# Patient Record
Sex: Female | Born: 1975 | Race: White | Hispanic: No | Marital: Single | Smoking: Never smoker
Health system: Southern US, Community
[De-identification: ages and names within clinical notes are randomized; demographics above are authoritative.]

## PROBLEM LIST (undated history)

## (undated) DIAGNOSIS — Q909 Down syndrome, unspecified: Secondary | ICD-10-CM

## (undated) DIAGNOSIS — F32A Depression, unspecified: Secondary | ICD-10-CM

## (undated) DIAGNOSIS — R51 Headache: Secondary | ICD-10-CM

## (undated) DIAGNOSIS — F329 Major depressive disorder, single episode, unspecified: Secondary | ICD-10-CM

## (undated) DIAGNOSIS — F419 Anxiety disorder, unspecified: Secondary | ICD-10-CM

## (undated) DIAGNOSIS — R519 Headache, unspecified: Secondary | ICD-10-CM

## (undated) DIAGNOSIS — R569 Unspecified convulsions: Secondary | ICD-10-CM

## (undated) DIAGNOSIS — E079 Disorder of thyroid, unspecified: Secondary | ICD-10-CM

## (undated) HISTORY — PX: OTHER SURGICAL HISTORY: SHX169

---

## 1999-01-06 ENCOUNTER — Encounter: Admission: RE | Admit: 1999-01-06 | Discharge: 1999-04-06 | Payer: Self-pay | Admitting: Family Medicine

## 2001-09-05 ENCOUNTER — Ambulatory Visit (HOSPITAL_COMMUNITY): Admission: RE | Admit: 2001-09-05 | Discharge: 2001-09-05 | Payer: Self-pay | Admitting: *Deleted

## 2002-01-18 ENCOUNTER — Encounter: Payer: Self-pay | Admitting: Otolaryngology

## 2002-01-19 ENCOUNTER — Ambulatory Visit (HOSPITAL_COMMUNITY): Admission: RE | Admit: 2002-01-19 | Discharge: 2002-01-19 | Payer: Self-pay | Admitting: Otolaryngology

## 2002-06-23 ENCOUNTER — Ambulatory Visit (HOSPITAL_BASED_OUTPATIENT_CLINIC_OR_DEPARTMENT_OTHER): Admission: RE | Admit: 2002-06-23 | Discharge: 2002-06-23 | Payer: Self-pay | Admitting: Otolaryngology

## 2002-12-02 ENCOUNTER — Emergency Department (HOSPITAL_COMMUNITY): Admission: EM | Admit: 2002-12-02 | Discharge: 2002-12-02 | Payer: Self-pay | Admitting: Emergency Medicine

## 2004-01-03 ENCOUNTER — Ambulatory Visit (HOSPITAL_BASED_OUTPATIENT_CLINIC_OR_DEPARTMENT_OTHER): Admission: RE | Admit: 2004-01-03 | Discharge: 2004-01-03 | Payer: Self-pay | Admitting: Obstetrics and Gynecology

## 2004-08-15 ENCOUNTER — Encounter: Admission: RE | Admit: 2004-08-15 | Discharge: 2004-11-13 | Payer: Self-pay | Admitting: Unknown Physician Specialty

## 2005-01-13 ENCOUNTER — Encounter: Admission: RE | Admit: 2005-01-13 | Discharge: 2005-01-13 | Payer: Self-pay | Admitting: Unknown Physician Specialty

## 2005-06-10 ENCOUNTER — Encounter: Admission: RE | Admit: 2005-06-10 | Discharge: 2005-09-08 | Payer: Self-pay | Admitting: Unknown Physician Specialty

## 2005-12-08 ENCOUNTER — Encounter: Admission: RE | Admit: 2005-12-08 | Discharge: 2005-12-08 | Payer: Self-pay | Admitting: Unknown Physician Specialty

## 2006-11-23 ENCOUNTER — Ambulatory Visit: Payer: Self-pay | Admitting: Family Medicine

## 2006-12-08 ENCOUNTER — Encounter (INDEPENDENT_AMBULATORY_CARE_PROVIDER_SITE_OTHER): Payer: Self-pay | Admitting: Family Medicine

## 2006-12-08 ENCOUNTER — Ambulatory Visit (HOSPITAL_COMMUNITY): Admission: RE | Admit: 2006-12-08 | Discharge: 2006-12-08 | Payer: Self-pay | Admitting: Family Medicine

## 2006-12-08 ENCOUNTER — Ambulatory Visit: Payer: Self-pay | Admitting: Internal Medicine

## 2007-01-25 ENCOUNTER — Ambulatory Visit: Payer: Self-pay | Admitting: Internal Medicine

## 2007-01-25 ENCOUNTER — Encounter (INDEPENDENT_AMBULATORY_CARE_PROVIDER_SITE_OTHER): Payer: Self-pay | Admitting: Nurse Practitioner

## 2007-01-25 LAB — CONVERTED CEMR LAB
Cholesterol: 161 mg/dL (ref 0–200)
HDL: 42 mg/dL (ref >39)
LDL Cholesterol: 86 mg/dL (ref 0–99)
Triglycerides: 163 mg/dL — ABNORMAL HIGH (ref <150)

## 2007-03-16 DIAGNOSIS — F411 Generalized anxiety disorder: Secondary | ICD-10-CM | POA: Insufficient documentation

## 2007-03-16 DIAGNOSIS — E039 Hypothyroidism, unspecified: Secondary | ICD-10-CM

## 2007-03-16 DIAGNOSIS — Q909 Down syndrome, unspecified: Secondary | ICD-10-CM | POA: Insufficient documentation

## 2007-03-16 DIAGNOSIS — M21619 Bunion of unspecified foot: Secondary | ICD-10-CM

## 2009-05-21 ENCOUNTER — Ambulatory Visit (HOSPITAL_COMMUNITY): Admission: RE | Admit: 2009-05-21 | Discharge: 2009-05-21 | Payer: Self-pay | Admitting: Cardiovascular Disease

## 2010-06-08 ENCOUNTER — Encounter: Payer: Self-pay | Admitting: Cardiovascular Disease

## 2010-10-03 NOTE — Op Note (Signed)
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   PROCEDURE:  Right myringotomy with tube placement and left micro-ear exam  with debridement.   SURGEON:  Lucky Cowboy, M.D.   ANESTHESIA:  General.   ESTIMATED BLOOD LOSS:  None.   COMPLICATIONS:  None.   INDICATIONS:  This patient is a 35 year old female with Down syndrome who  has experienced chronic problems with otitis media.  Exam in the office  revealed bilateral occlusion on the ear canal, and the patient would now  allow debridement.  Moderate hearing losses were noted bilaterally.  For  this reason, the patient is taken to the operating room with planned tube  placement.   FINDINGS:  The patient was noted to have thickened right tympanic membrane  with mucoid middle ear fluid.  The findings on the left revealed tympanic  membrane perforation in the posterior superior quadrant with some middle ear  debris.  There was possibility of early cholesteatoma.   DESCRIPTION OF PROCEDURE:  The patient was taken to the operating room and  placed on the table in a supine position.  She was then placed under general  mask anesthesia and a #5 ear speculum placed into the right external  auditory canal.  A tympanotomy tube in the process of extruding noted in the  right ear under microscopic exam was noted in the posterior superior  quadrant.  This was removed and a myringotomy made in the anterior  inferior  quadrant.  A Richards 1.32 mm modified T-tube was then placed through the  tympanic membrane and secured in place with the pick.  Ciprofloxacin Otic  drops were instilled.  Attention was then turned to the left ear.  A  significant amount of debris was removed from the ear canal.  At this point,  middle ear debris was also removed which had the suggestion of  cholesteatoma.  The tympanic membrane perforation was approximately 30%  starting in the posterior superior quadrant, and involving some of the  posterior inferior quadrant.  Cipro HC was instilled.  The patient was  awakened from anesthesia and taken to the postanesthesia care unit in stable  condition.  There were no complications.  Lucky Cowboy, MD    SJ/MEDQ  D:  03/01/2002  T:  03/01/2002  Job:  [id number]

## 2010-10-03 NOTE — Cardiovascular Report (Signed)
[place]  Patient:    Ann Yang, Ann Yang Visit Number: [id number] MRN: [id number]          Service Type: END Location: ENDO Attending Physician:  Darlin Priestly Dictated by:   Netta Cedars, M.D. Proc. Date: 09/05/01 Admit Date:  09/05/2001 Discharge Date: 09/05/2001   CC:         Delorse Lek, M.D.  [facility]   Cardiac Catheterization  REFERRING PHYSICIAN:  Delorse Lek, M.D.  PROCEDURE:  Transesophageal echocardiogram, limited.  CARDIOLOGIST:  Netta Cedars, M.D.  INDICATIONS: She had an echocardiogram in February 2001 which revealed normal left ventricular size and function, mild aortic valve sclerosis without stenosis, thickened anterior mitral valve leaflet, no mitral valve prolapse, mild mitral regurgitation, moderate tricuspid regurgitation, ventricular septal defect as well as an atrial septal defect near the level of the AV valve.  This abnormality was what prompted the recommendation for transesophageal echocardiogram.  This has been scheduled and canceled on two to three occasions in the past secondary to scheduling conflicts.  [name] is a 35 year old white female with a history of Downs syndrome and congestive heart failure.  She is here today for further evaluation of possible endocardial cushion defect.  DESCRIPTION OF PROCEDURE: Consent was obtained prior to her arriving at the hospital.  She received 5 mg of Versed and 100 mcg of fentanyl IV.  We were able to intubated without difficulty, however, several minutes into the procedure she became quite agitated (extreme) and inadvertently pulled out her IV.  At this time, the procedure was abandoned as she was too agitated to a) safely continue the procedure and b) unable to obtain further interpretable images.  Therefore, the probe was removed and she suffered no further complications.  The patient was awake and alert at the  conclusion of the procedure and suffered no immediate complications.  IMPRESSION: 1. Bicuspid aortic valve. 2. Atrial septal defect versus endocardial cushion defect as it is quite    proximal to the atrioventricular valves.  This was measured at 0.75 cm.    Unfortunately, I was only able to obtain one measurement secondary to    extreme agitation. 3. Probable ventricular septal defect but unable to confirm. 4. Echodensity originating either from the tricuspid valve apparatus or    the intraventricular septum just below the tricuspid valve on the    right side.  This is very small but I was unable to quantify. 5. Unable secondary to the difficulties noted above to further    evaluate for left ventricular function, atrial size, regurgitant    lesions, etc. 6. If repeat transesophageal echocardiogram is requested would    recommend anesthesia to manage procedurally. Dictated by:   Netta Cedars, M.D. Attending Physician:  Darlin Priestly DD:  09/05/01 TD:  09/05/01 Job: [id number]

## 2010-10-03 NOTE — Op Note (Signed)
NAME:  Ann Yang, ORNSTEIN                           ACCOUNT NO.:  [id number]   MEDICAL RECORD NO.:  [id number]                   PATIENT TYPE:  AMB   LOCATION:  DSC                                  FACILITY:  [facility]   PHYSICIAN:  Lucky Cowboy, M.D.                    DATE OF BIRTH:  Jul 22, 1975   DATE OF PROCEDURE:  06/23/2002  DATE OF DISCHARGE:  06/23/2002                                 OPERATIVE REPORT   PREOPERATIVE DIAGNOSIS:  Chronic otitis media.   POSTOPERATIVE DIAGNOSIS:  Chronic otitis media.   PROCEDURE:  Right laryngotomy with tube placement and debridement, left  middle ear space.   SURGEON:  Lucky Cowboy, M.D.   ANESTHESIA:  General.   ESTIMATED BLOOD LOSS:  None.   COMPLICATIONS:  None.   INDICATIONS FOR PROCEDURE:  This patient is a 35 year old Downs syndrome  patient who has undergone tube placement in the past in the right ear.  She  has had some cerumen accumulation and the patient will not even allow  examination in the office.  For these reasons, the above procedures are  performed.   FINDINGS:  The patient was noted to have an extruded right tube.  For this  reason, the tube was replaced.  The middle ear had a scant amount of fluid  and some retraction.  The left tympanic membrane had approximately 40%  perforation involving the posterior, superior, inferior, and a portion of  the anterior inferior quadrants.  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  It was also attached to the malleolus.   PROCEDURE:  The patient was taken to the operating room and placed on the  table in the supine position.  She was then placed under general MAC  anesthesia and a #4 ear speculum placed into the right external auditory  canal.  With the aid of the operating microscope, cerumen was removed with a  curette and suctioned.  The existing tympanotomy tube in the canal was  removed.  A laryngotomy knife was used to make an incision in the anterior  inferior  quadrant.  No left fluid was evacuated.  A 1.27-mm internal  diameter T-tube was then placed through the tympanic membrane, secured in  place with a pick.  _______ was instilled.  Attention was turned to the left  ear.  In similar fashion, cerumen was removed and under the microscope,  cholesteatoma tunnel was removed from the posterior superior quadrant.  It  could not be completely removed.  The patient was awakened from anesthesia  and taken to the postanesthesia care unit in stable condition.  There were  no complications.  Lucky Cowboy, M.D.    SJ/MEDQ  D:  06/28/2002  T:  06/28/2002  Job:  [id number]

## 2010-10-03 NOTE — Op Note (Signed)
NAME:  Ann Yang, ASBILL                           ACCOUNT NO.:  [id number]   MEDICAL RECORD NO.:  [id number]                   PATIENT TYPE:  AMB   LOCATION:  NESC                                 FACILITY:  [facility]   PHYSICIAN:  Katherine Roan, M.D.               DATE OF BIRTH:  04-28-1976   DATE OF PROCEDURE:  01/03/2004  DATE OF DISCHARGE:                                 OPERATIVE REPORT   PREOPERATIVE DIAGNOSES:  Down syndrome (mentally challenged).   POSTOPERATIVE DIAGNOSES:  Down syndrome (mentally challenged).   OPERATION:  Pelvic exam and breast exam, gynecologic exam under anesthesia.   The patient was placed in the supine position, general anesthesia given and  breast and neck exam was normal, no masses noted. The axilla were free from  adenopathy. I felt no intraabdominal masses.  Pelvic examination revealed a  virginal introitus and hymeneal ring appeared to be intact.  A small  speculum was placed into the vagina, cervix was visualized, appeared normal.  The uterus was normal size and shape. No masses were noted in the adnexa and  rectovaginal confirmed.   IMPRESSION:  Normal GYN exam.                                               Katherine Roan, M.D.    SDM/MEDQ  D:  01/03/2004  T:  01/03/2004  Job:  [id number]

## 2010-10-03 NOTE — Op Note (Signed)
   NAMEKIMRA, KANTOR                           ACCOUNT NO.:  [id number]   MEDICAL RECORD NO.:  [id number]                   PATIENT TYPE:  OIB   LOCATION:  2899                                 FACILITY:  [facility]   PHYSICIAN:  Dorna Leitz, M.D.                 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   PROCEDURES:  1. Right myringotomy with tube placement.  2. On the left, micro ear examination with debridement.   SURGEON:  Dorna Leitz, M.D.   ANESTHESIA:  General.   ESTIMATED BLOOD LOSS:  None.   COMPLICATIONS:  None.   INDICATIONS:  Dictation stopped at that point.                                               Dorna Leitz, M.D.    SLJ/MEDQ  D:  01/19/2002  T:  01/19/2002  Job:  [id number]

## 2012-03-01 ENCOUNTER — Emergency Department (HOSPITAL_COMMUNITY)
Admission: EM | Admit: 2012-03-01 | Discharge: 2012-03-01 | Disposition: A | Discharge status: Home / Self Care | Payer: Medicaid Other | Source: Home / Self Care | Attending: Emergency Medicine | Admitting: Emergency Medicine

## 2012-03-01 ENCOUNTER — Emergency Department (INDEPENDENT_AMBULATORY_CARE_PROVIDER_SITE_OTHER): Payer: Medicaid Other

## 2012-03-01 ENCOUNTER — Encounter (HOSPITAL_COMMUNITY): Payer: Self-pay | Admitting: Emergency Medicine

## 2012-03-01 DIAGNOSIS — T148XXA Other injury of unspecified body region, initial encounter: Secondary | ICD-10-CM

## 2012-03-01 DIAGNOSIS — IMO0002 Reserved for concepts with insufficient information to code with codable children: Secondary | ICD-10-CM

## 2012-03-01 HISTORY — DX: Down syndrome, unspecified: Q90.9

## 2012-03-01 HISTORY — DX: Disorder of thyroid, unspecified: E07.9

## 2012-03-01 NOTE — ED Notes (Signed)
Guardian of patient came to hallway to check on delay.  Informed they would be seen.  Delay secondary to department acuity

## 2012-03-01 NOTE — ED Notes (Signed)
Right hand injury today around 10:00 a.m. Today.  Reports caught right hand in car door.  Injury to right index finger

## 2012-03-01 NOTE — ED Notes (Signed)
Tetanus within 5 years

## 2012-03-01 NOTE — ED Provider Notes (Signed)
Chief Complaint  Patient presents with  . Hand Pain    History of Present Illness:   Ann Yang is a 36 year old female with Down's syndrome who comes in today with her foster mother. She injured her right index finger today. She slammed it in a car door at day care. Her last tetanus shot was about 5 years ago. She able to move the finger well. She can't give much further history since she is nonverbal.  Review of Systems:  Unobtainable since the patient is nonverbal.  PMFSH:  Past medical history, family history, social history, meds, and allergies were reviewed.  Physical Exam:   Vital signs:  BP 128/74  Pulse 73  Temp 98.7 F (37.1 C) (Oral)  Resp 16  SpO2 97% Gen:  Alert and in no distress. Musculoskeletal: She has a small, L-shaped, flap laceration on the distal phalanx of her right index finger. She's able to move the finger well. Otherwise, all joints had a full a ROM with no swelling, bruising or deformity.  No edema, pulses full. Extremities were warm and pink.  Capillary refill was brisk.  Skin:  Clear, warm and dry.  No rash. Neuro:  Alert and oriented times 3.  Muscle strength was normal.  Sensation was intact to light touch.   Radiology:  Dg Finger Index Right  03/01/2012  *RADIOLOGY REPORT*  Clinical Data: Pain post trauma  RIGHT INDEX FINGER 2+V  Comparison: None.  Findings: Frontal, oblique, and lateral views were obtained.  There is no fracture or dislocation.  Joint spaces appear intact.  No erosive change.  IMPRESSION:  No fracture or dislocation.   Original Report Authenticated By: Arvin Collard. WOODRUFF III, M.D.    I reviewed the images independently and personally and concur with the radiologist's findings.  Course in Urgent Care Center:   The patient's foster mother and guardian suggested that we not suture this since she does not think the patient would be cooperative. Since it is so small I think it's okay to close it with Steri-Strips. The wound is cleansed with  saline, tincture of benzoin was applied around the laceration that was Steri-Stripped. A gauze bandage was then applied and the foster mother was instructed in wound care. She will watch out for signs of infection and return if there's any problems.  Assessment:  The encounter diagnosis was Laceration.  Plan:   1.  The following meds were prescribed:   New Prescriptions   No medications on file   2.  The patient was instructed in symptomatic care, including rest and activity, elevation, application of ice and compression.  Appropriate handouts were given. 3.  The patient was told to return if becoming worse in any way, and given some red flag symptoms that would indicate earlier return.   4.  The patient was told to follow up here if any sign of infection.   Reuben Likes, MD 03/01/12 2013

## 2012-07-19 ENCOUNTER — Emergency Department (HOSPITAL_COMMUNITY)
Admission: EM | Admit: 2012-07-19 | Discharge: 2012-07-19 | Disposition: A | Discharge status: Home / Self Care | Payer: Medicare Other | Attending: Emergency Medicine | Admitting: Emergency Medicine

## 2012-07-19 ENCOUNTER — Encounter (HOSPITAL_COMMUNITY): Payer: Self-pay | Admitting: Emergency Medicine

## 2012-07-19 ENCOUNTER — Emergency Department (HOSPITAL_COMMUNITY): Payer: Medicare Other

## 2012-07-19 DIAGNOSIS — E079 Disorder of thyroid, unspecified: Secondary | ICD-10-CM | POA: Insufficient documentation

## 2012-07-19 DIAGNOSIS — Y939 Activity, unspecified: Secondary | ICD-10-CM | POA: Insufficient documentation

## 2012-07-19 DIAGNOSIS — IMO0002 Reserved for concepts with insufficient information to code with codable children: Secondary | ICD-10-CM | POA: Insufficient documentation

## 2012-07-19 DIAGNOSIS — R079 Chest pain, unspecified: Secondary | ICD-10-CM | POA: Insufficient documentation

## 2012-07-19 DIAGNOSIS — Z79899 Other long term (current) drug therapy: Secondary | ICD-10-CM | POA: Insufficient documentation

## 2012-07-19 DIAGNOSIS — Z87798 Personal history of other (corrected) congenital malformations: Secondary | ICD-10-CM | POA: Insufficient documentation

## 2012-07-19 DIAGNOSIS — T17908A Unspecified foreign body in respiratory tract, part unspecified causing other injury, initial encounter: Secondary | ICD-10-CM | POA: Insufficient documentation

## 2012-07-19 DIAGNOSIS — T17320A Food in larynx causing asphyxiation, initial encounter: Secondary | ICD-10-CM

## 2012-07-19 DIAGNOSIS — Y929 Unspecified place or not applicable: Secondary | ICD-10-CM | POA: Insufficient documentation

## 2012-07-19 LAB — CBC WITH DIFFERENTIAL/PLATELET
Basophils Absolute: 0 10*3/uL (ref 0.0–0.1)
Lymphocytes Relative: 14 % (ref 12–46)
Neutro Abs: 5.8 10*3/uL (ref 1.7–7.7)
Neutrophils Relative %: 84 % — ABNORMAL HIGH (ref 43–77)
Platelets: 109 10*3/uL — ABNORMAL LOW (ref 150–400)
RDW: 12.6 % (ref 11.5–15.5)
WBC: 6.9 10*3/uL (ref 4.0–10.5)

## 2012-07-19 LAB — BASIC METABOLIC PANEL
CO2: 23 mEq/L (ref 19–32)
Calcium: 8.9 mg/dL (ref 8.4–10.5)
Chloride: 99 mEq/L (ref 96–112)
Sodium: 137 mEq/L (ref 135–145)

## 2012-07-19 MED ORDER — AMOXICILLIN-POT CLAVULANATE 875-125 MG PO TABS: 1.0000 | ORAL_TABLET | Freq: Two times a day (BID) | ORAL | Status: DC

## 2012-07-19 MED ORDER — SODIUM CHLORIDE 0.9 % IV BOLUS (SEPSIS)
1000.0000 mL | INTRAVENOUS | Status: AC
  Administered 2012-07-19: 1000 mL via INTRAVENOUS

## 2012-07-19 NOTE — ED Notes (Addendum)
Pt arrives via EMS for choking. On arrival pt with agonal breathing, went apneic, PEA on monitor, CPR initiated by EMS. Airway cleared of peanut butter became responsive. Arrives alert, following commands. 15g LLE IO

## 2012-07-19 NOTE — ED Notes (Signed)
Pt d/c home in NAD. Pt caregiver voiced understanding of d/c instructions and follow up care.

## 2012-07-19 NOTE — ED Provider Notes (Signed)
History     CSN: [id number]  Arrival date & time 07/19/12  1455   None     Chief Complaint  Patient presents with  . Choking    (Consider location/radiation/quality/duration/timing/severity/associated sxs/prior treatment) Patient is a 37 y.o. female presenting with foreign body. The history is limited by the absence of a caregiver and a developmental delay. No language interpreter was used.  Foreign Body  The foreign body is suspected to be aspirated. The foreign body is food. The incident was witnessed. The incident was witnessed/reported by a caregiver. Associated symptoms include chest pain and choking. Pertinent negatives include no vomiting and no difficulty breathing. Behavior: became unresponsive, had CPR by EMS, airway cleared & pulses returned. There were no sick contacts. She has received no recent medical care.    Past Medical History  Diagnosis Date  . Thyroid disease   . Down's syndrome     History reviewed. No pertinent past surgical history.  History reviewed. No pertinent family history.  History  Substance Use Topics  . Smoking status: Never Smoker   . Smokeless tobacco: Not on file  . Alcohol Use: No    OB History   Grav Para Term Preterm Abortions TAB SAB Ect Mult Living                  Review of Systems  Unable to perform ROS: Other  Eyes: Negative for photophobia and discharge.  Respiratory: Positive for choking. Negative for chest tightness.   Cardiovascular: Positive for chest pain.  Gastrointestinal: Negative for vomiting.    Allergies  Review of patient's allergies indicates no known allergies.  Home Medications   Current Outpatient Rx  Name  Route  Sig  Dispense  Refill  . ALPRAZolam (XANAX) 0.5 MG tablet   Oral   Take 0.5 mg by mouth 2 (two) times daily.         Marland Kitchen levothyroxine (SYNTHROID, LEVOTHROID) 50 MCG tablet   Oral   Take 50 mcg by mouth daily.         . risperiDONE (RISPERDAL) 2 MG tablet   Oral   Take 2  mg by mouth at bedtime.          . sertraline (ZOLOFT) 100 MG tablet   Oral   Take 100 mg by mouth at bedtime.          Marland Kitchen amoxicillin-clavulanate (AUGMENTIN) 875-125 MG per tablet   Oral   Take 1 tablet by mouth every 12 (twelve) hours. If Harriett Sine developed cough, fever, trouble breathing   14 tablet   0     BP 103/65  Pulse 84  Temp(Src) 94.9 F (34.9 C) (Rectal)  Resp 13  SpO2 96%  Physical Exam  Constitutional: She appears well-developed and well-nourished. No distress.  HENT:  Head: Normocephalic and atraumatic.  Mouth/Throat: No oropharyngeal exudate.  Eyes: Pupils are equal, round, and reactive to light.  Neck: Normal range of motion. Neck supple.  Cardiovascular: Normal rate, regular rhythm and normal heart sounds.  Exam reveals no gallop and no friction rub.   No murmur heard. Pulmonary/Chest: Effort normal and breath sounds normal. No respiratory distress. She has no wheezes. She has no rales. She exhibits tenderness.    Abdominal: Soft. Bowel sounds are normal. She exhibits no distension and no mass. There is no tenderness. There is no rebound and no guarding.  Musculoskeletal: Normal range of motion. She exhibits no edema and no tenderness.  Neurological: She is alert. She exhibits  normal muscle tone. GCS eye subscore is 4. GCS verbal subscore is 4. GCS motor subscore is 6.  Skin: Skin is warm and dry.  Psychiatric: She has a normal mood and affect.    ED Course  Procedures (including critical care time)  Labs Reviewed  CBC WITH DIFFERENTIAL - Abnormal; Notable for the following:    Platelets 109 (*)    Neutrophils Relative 84 (*)    All other components within normal limits  BASIC METABOLIC PANEL - Abnormal; Notable for the following:    Glucose, Bld 222 (*)    All other components within normal limits   Dg Chest Portable 1 View  07/19/2012  *RADIOLOGY REPORT*  Clinical Data: 37 year old female status post CPR following choking episode.  PORTABLE  CHEST - 1 VIEW  Comparison: Chest CT 05/31/2009.  Findings: AP portable semi upright view 1527 hours.  Kyphotic view. Cardiomegaly appears stable. Other mediastinal contours are within normal limits.  Mild respiratory motion artifact. Visualized tracheal air column is within normal limits. Basilar predominant increased interstitial opacity, favor vascular congestion.  No pneumothorax,  pleural effusion or consolidation.  IMPRESSION: Cardiomegaly and basilar predominant vascular congestion without overt pulmonary edema.   Original Report Authenticated By: Odessa Fleming III, M.D.      1. Airway obstruction due to foreign body, initial encounter   2. Choking due to food (regurgitated), initial encounter       MDM  Pt is a 37 y.o. female with pertinent PMHX of Down's syndrome, thyroid disease who presents after choking episode that lead to PEA.  EMS initially called out to choking, when fire arrived, pt was breathing spontaneously but they were unable to clear her airway. , When EMS arrived was pulseless & apneic.  CPR started, and airway cleared with forceps, pulses returned (thought to be peanut butter sandwich).  Upon arrival, pt sitting upright, interactive, alert, complains of chest pain.  Arrest likely due to airway obstruction.  Have ordered CXR, EKG, CBC, BMP, will monitor.    4:50 PM Pt has no complaints currently, I have spoken to caregiver who reports she is back to baseline.  BP in low 90's systolic, then into 80's w/o change in mentation.  In reviewing records by caregiver, two prior BP's recorded 106 and 100 systolic.  Caregiver reports her having a SBP in 90's in past as well.   6:03 PMBP improved.  Will d/c home with return precautions for new or worsening symptoms, specially for symptoms of pneumonia.  Rx given for Augmentin, which pt will begin if she develops cough, fever, trouble breathing, but will at that point return ot ED or f/u with PCP.    1. Airway obstruction due to foreign body,  initial encounter   2. Choking due to food (regurgitated), initial encounter      Labs and imaging considered in decision making, reviewed by myself.  Imaging interpreted by radiology. Pt care discussed with my attending, Dr. Freida Busman.    Toy Cookey, MD 07/20/12 [phone]

## 2012-07-19 NOTE — ED Notes (Signed)
MD notified of pt Low BP

## 2012-07-19 NOTE — ED Provider Notes (Signed)
I saw and evaluated the patient, reviewed the resident's note and I agree with the findings and plan.  Patient seen examined. No signs of respiratory distress at this time. Pulse oximetry stable. Will be discharged    Toy Baker, MD 07/19/12 [phone]

## 2012-07-20 NOTE — ED Provider Notes (Signed)
I saw and evaluated the patient, reviewed the resident's note and I agree with the findings and plan.  Anthony T Umana, MD 07/20/12 1353 

## 2013-10-02 ENCOUNTER — Emergency Department (HOSPITAL_COMMUNITY)
Admission: EM | Admit: 2013-10-02 | Discharge: 2013-10-02 | Disposition: A | Discharge status: Home / Self Care | Payer: Medicare Other | Source: Home / Self Care | Attending: Emergency Medicine | Admitting: Emergency Medicine

## 2013-10-02 ENCOUNTER — Encounter (HOSPITAL_COMMUNITY): Payer: Self-pay | Admitting: Emergency Medicine

## 2013-10-02 DIAGNOSIS — T148 Other injury of unspecified body region: Secondary | ICD-10-CM

## 2013-10-02 DIAGNOSIS — W57XXXA Bitten or stung by nonvenomous insect and other nonvenomous arthropods, initial encounter: Secondary | ICD-10-CM

## 2013-10-02 NOTE — Discharge Instructions (Signed)
Tick Bite Information Ticks are insects that attach themselves to the skin and draw blood for food. There are various types of ticks. Common types include wood ticks and deer ticks. Most ticks live in shrubs and grassy areas. Ticks can climb onto your body when you make contact with leaves or grass where the tick is waiting. The most common places on the body for ticks to attach themselves are the scalp, neck, armpits, waist, and groin. Most tick bites are harmless, but sometimes ticks carry germs that cause diseases. These germs can be spread to a person during the tick's feeding process. The chance of a disease spreading through a tick bite depends on:   The type of tick.  Time of year.   How long the tick is attached.   Geographic location.  HOW CAN YOU PREVENT TICK BITES? Take these steps to help prevent tick bites when you are outdoors:  Wear protective clothing. Long sleeves and long pants are best.   Wear white clothes so you can see ticks more easily.  Tuck your pant legs into your socks.   If walking on a trail, stay in the middle of the trail to avoid brushing against bushes.  Avoid walking through areas with long grass.  Put insect repellent on all exposed skin and along boot tops, pant legs, and sleeve cuffs.   Check clothing, hair, and skin repeatedly and before going inside.   Brush off any ticks that are not attached.  Take a shower or bath as soon as possible after being outdoors.  WHAT IS THE PROPER WAY TO REMOVE A TICK? Ticks should be removed as soon as possible to help prevent diseases caused by tick bites. 1. If latex gloves are available, put them on before trying to remove a tick.  2. Using fine-point tweezers, grasp the tick as close to the skin as possible. You may also use curved forceps or a tick removal tool. Grasp the tick as close to its head as possible. Avoid grasping the tick on its body. 3. Pull gently with steady upward pressure until  the tick lets go. Do not twist the tick or jerk it suddenly. This may break off the tick's head or mouth parts. 4. Do not squeeze or crush the tick's body. This could force disease-carrying fluids from the tick into your body.  5. After the tick is removed, wash the bite area and your hands with soap and water or other disinfectant such as alcohol. 6. Apply a small amount of antiseptic cream or ointment to the bite site.  7. Wash and disinfect any instruments that were used.  Do not try to remove a tick by applying a hot match, petroleum jelly, or fingernail polish to the tick. These methods do not work and may increase the chances of disease being spread from the tick bite.  WHEN SHOULD YOU SEEK MEDICAL CARE? Contact your health care provider if you are unable to remove a tick from your skin or if a part of the tick breaks off and is stuck in the skin.  After a tick bite, you need to be aware of signs and symptoms that could be related to diseases spread by ticks. Contact your health care provider if you develop any of the following in the days or weeks after the tick bite:  Unexplained fever.  Rash. A circular rash that appears days or weeks after the tick bite may indicate the possibility of Lyme disease. The rash may resemble   a target with a bull's-eye and may occur at a different part of your body than the tick bite.  Redness and swelling in the area of the tick bite.   Tender, swollen lymph glands.   Diarrhea.   Weight loss.   Cough.   Fatigue.   Muscle, joint, or bone pain.   Abdominal pain.   Headache.   Lethargy or a change in your level of consciousness.  Difficulty walking or moving your legs.   Numbness in the legs.   Paralysis.  Shortness of breath.   Confusion.   Repeated vomiting.  Document Released: 05/01/2000 Document Revised: 02/22/2013 Document Reviewed: 10/12/2012 Exit[employer] Patient Information 2014 [employer].  

## 2013-10-02 NOTE — ED Provider Notes (Signed)
  Chief Complaint   Chief Complaint  Patient presents with  . 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 The group home worker noted a tick on her scalp today. She thinks is only been present for a few hours. There is no fever, headache, or generalized rash.  Review of Systems   Other than as noted above, the patient denies any of the following symptoms: Systemic:  No fevers, chills, sweats, weight loss or gain, fatigue, or tiredness.  PMFSH   Past medical history, family history, social history, meds, and allergies were reviewed.    Physical Examination    Vital signs:  BP 114/63  Pulse 68  Temp(Src) 98 F (36.7 C) (Oral)  Resp 14  SpO2 100% General:  Alert and oriented.  In no distress.  Skin warm and dry. Scalp: There is a tick attached to her left parietal area.  Course in [facility]   The area was prepped with alcohol and the tick was easily removed with a forceps.  Assessment   The encounter diagnosis was Tick bite.  Plan   1.  Meds:  The following meds were prescribed:   Discharge Medication List as of 10/02/2013 11:15 AM      2.  Patient Education/Counseling:  The patient was given appropriate handouts, self care instructions, and instructed in symptomatic relief.  Suggested application of antibiotic ointment for 24-48 hours.  3.  Follow up:  The patient was told to follow up here if no better in 3 to 4 days, or sooner if becoming worse in any way, and given some red flag symptoms such as fever, headache, muscle aches, or generalized rash which would prompt immediate return.  Follow up here as necessary.      Reuben Likesavid C Hilda Wexler, MD 10/02/13 [id number]

## 2013-10-02 NOTE — ED Notes (Signed)
Reports tick on scalp noticed today.  Denies any other concerns.

## 2013-10-10 ENCOUNTER — Encounter (HOSPITAL_COMMUNITY): Payer: Self-pay | Admitting: Emergency Medicine

## 2013-10-10 ENCOUNTER — Emergency Department (INDEPENDENT_AMBULATORY_CARE_PROVIDER_SITE_OTHER)
Admission: EM | Admit: 2013-10-10 | Discharge: 2013-10-10 | Disposition: A | Discharge status: Home / Self Care | Payer: Medicare Other | Source: Home / Self Care | Attending: Family Medicine | Admitting: Family Medicine

## 2013-10-10 DIAGNOSIS — S1093XA Contusion of unspecified part of neck, initial encounter: Secondary | ICD-10-CM

## 2013-10-10 DIAGNOSIS — S0003XA Contusion of scalp, initial encounter: Secondary | ICD-10-CM

## 2013-10-10 DIAGNOSIS — S0083XA Contusion of other part of head, initial encounter: Secondary | ICD-10-CM

## 2013-10-10 NOTE — Discharge Instructions (Signed)
Facial or Scalp Contusion  A facial or scalp contusion is a deep bruise on the face or head. Injuries to the face and head generally cause a lot of swelling, especially around the eyes. Contusions are the result of an injury that caused bleeding under the skin. The contusion may turn blue, purple, or yellow. Minor injuries will give you a painless contusion, but more severe contusions may stay painful and swollen for a few weeks.   CAUSES   A facial or scalp contusion is caused by a blunt injury or trauma to the face or head area.   SIGNS AND SYMPTOMS   · Swelling of the injured area.    · Discoloration of the injured area.    · Tenderness, soreness, or pain in the injured area.    DIAGNOSIS   The diagnosis can be made by taking a medical history and doing a physical exam. An X-ray exam, CT scan, or MRI may be needed to determine if there are any associated injuries, such as broken bones (fractures).  TREATMENT   Often, the best treatment for a facial or scalp contusion is applying cold compresses to the injured area. Over-the-counter medicines may also be recommended for pain control.   HOME CARE INSTRUCTIONS   · Only take over-the-counter or prescription medicines as directed by your health care provider.    · Apply ice to the injured area.    · Put ice in a plastic bag.    · Place a towel between your skin and the bag.    · Leave the ice on for 20 minutes, 2 3 times a day.    SEEK MEDICAL CARE IF:  · You have bite problems.    · You have pain with chewing.    · You are concerned about facial defects.  SEEK IMMEDIATE MEDICAL CARE IF:  · You have severe pain or a headache that is not relieved by medicine.    · You have unusual sleepiness, confusion, or personality changes.    · You throw up (vomit).    · You have a persistent nosebleed.    · You have double vision or blurred vision.    · You have fluid drainage from your nose or ear.    · You have difficulty walking or using your arms or legs.    MAKE SURE YOU:    · Understand these instructions.  · Will watch your condition.  · Will get help right away if you are not doing well or get worse.  Document Released: 06/11/2004 Document Revised: 02/22/2013 Document Reviewed: 12/15/2012  ExitCare® Patient Information ©2014 [employer].

## 2013-10-10 NOTE — ED Notes (Signed)
Reports possibly falling and hitting left eye or care giver states that the patient may have hit her self in the eye.  Pt has been using cold compresses for comfort.

## 2013-10-10 NOTE — ED Provider Notes (Signed)
CSN: [id number]     Arrival date & time 10/10/13  0905 History   First MD Initiated Contact with Patient 10/10/13 [id number]     Chief Complaint  Patient presents with  . Fall  . Eye Problem   (Consider location/radiation/quality/duration/timing/severity/associated sxs/prior Treatment) HPI Comments: 38 year old female with history of Down's syndrome, low functioning and completely dependent on caregiver, presents brought in by the caregiver for evaluation of a bruise under her left eye. The caregiver first noticed this yesterday the patient says that she fell to the caregiver is unsure of when this happened or what the circumstances were. There was some redness and it seemed irritated yesterday. The patient has hit herself in the eye before causing bruises but nothing this bad. Today, there is bruising with a very small amount of swelling. The patient does not seem to be bothered by this at all today. The caregiver has applied ice.  Patient is a 38 y.o. female presenting with fall and eye problem.  Fall  Eye Problem   Past Medical History  Diagnosis Date  . Thyroid disease   . Down's syndrome    History reviewed. No pertinent past surgical history. History reviewed. No pertinent family history. History  Substance Use Topics  . Smoking status: Never Smoker   . Smokeless tobacco: Not on file  . Alcohol Use: No   OB History   Grav Para Term Preterm Abortions TAB SAB Ect Mult Living                 Review of Systems  Unable to perform ROS: Patient nonverbal  Eyes:       Bruising under left eye, see history of present illness  All other systems reviewed and are negative.   Allergies  Review of patient's allergies indicates no known allergies.  Home Medications   Prior to Admission medications   Medication Sig Start Date End Date Taking? Authorizing Provider  ALPRAZolam Prudy Feeler(XANAX) 0.5 MG tablet Take 0.5 mg by mouth 2 (two) times daily.   Yes Historical Provider, MD    amoxicillin-clavulanate (AUGMENTIN) 875-125 MG per tablet Take 1 tablet by mouth every 12 (twelve) hours. If Harriett SineNancy developed cough, fever, trouble breathing 07/19/12  Yes Shanna CiscoMegan E Docherty, MD  levothyroxine (SYNTHROID, LEVOTHROID) 50 MCG tablet Take 50 mcg by mouth daily.   Yes Historical Provider, MD  risperiDONE (RISPERDAL) 2 MG tablet Take 2 mg by mouth at bedtime.    Yes Historical Provider, MD  sertraline (ZOLOFT) 100 MG tablet Take 100 mg by mouth at bedtime.    Yes Historical Provider, MD   BP 102/59  Pulse 72  Temp(Src) 98 F (36.7 C) (Oral)  Resp 16  SpO2 99% Physical Exam  Nursing note and vitals reviewed. Constitutional: She is oriented to person, place, and time. Vital signs are normal. She appears well-developed and well-nourished. No distress.  HENT:  Head: Normocephalic. Head is with contusion.    Eyes: Conjunctivae and EOM are normal. Pupils are equal, round, and reactive to light. Right eye exhibits no discharge. Left eye exhibits no discharge.  Pulmonary/Chest: Effort normal. No respiratory distress.  Neurological: She is alert and oriented to person, place, and time. She has normal strength. Coordination normal.  Skin: Skin is warm and dry. No rash noted. She is not diaphoretic.  Psychiatric: She has a normal mood and affect. Judgment normal.    ED Course  Procedures (including critical care time) Labs Review Labs Reviewed - No data to display  Imaging Review  No results found.   MDM   1. Bruise of face    Mild contusion, with no apparent tenderness with palpation of the orbital rim. Extraocular movements are intact without any pain. No signs of entrapment. The eyes are not injected or irritated. No indicatino or any serious injury.  Continue ice as needed. Ibuprofen if this becomes uncomfortable. Followup if worsening.    Graylon Good, PA-C 10/10/13 [phone]

## 2013-10-10 NOTE — ED Provider Notes (Signed)
Medical screening examination/treatment/procedure(s) were performed by resident physician or non-physician practitioner and as supervising physician I was immediately available for consultation/collaboration.   Barkley Bruns MD.   [name], MD 10/10/13 [phone]

## 2014-12-13 ENCOUNTER — Encounter: Payer: Self-pay | Admitting: Neurology

## 2014-12-13 ENCOUNTER — Ambulatory Visit (INDEPENDENT_AMBULATORY_CARE_PROVIDER_SITE_OTHER): Payer: Medicare Other | Admitting: Neurology

## 2014-12-13 VITALS — BP 87/60 | HR 81 | Ht <= 58 in | Wt 103.0 lb

## 2014-12-13 DIAGNOSIS — Q909 Down syndrome, unspecified: Secondary | ICD-10-CM

## 2014-12-13 DIAGNOSIS — R569 Unspecified convulsions: Secondary | ICD-10-CM | POA: Diagnosis not present

## 2014-12-13 DIAGNOSIS — G40209 Localization-related (focal) (partial) symptomatic epilepsy and epileptic syndromes with complex partial seizures, not intractable, without status epilepticus: Secondary | ICD-10-CM

## 2014-12-13 MED ORDER — LAMOTRIGINE ER 25 & 50 & 100 MG PO KIT: PACK | ORAL | Status: DC

## 2014-12-13 MED ORDER — LAMOTRIGINE ER 100 MG PO TB24: 100.0000 mg | ORAL_TABLET | Freq: Every day | ORAL | Status: DC

## 2014-12-13 NOTE — Progress Notes (Signed)
PATIENT: Ann Yang DOB: 04-18-1976  Chief Complaint  Patient presents with  . Seizure-like activity    She is here with her [facility] provider ([facility]), Ann Yang, with whom she resides.  She has had several episodes of loss of consciousness.  She does not have a history of seizures.  . Down Syndrome    HISTORICAL  Ann Yang is a 39 years old right-handed female, accompanied by her [facility] provider Ann Yang, seen in refer from her primary care nurse practitioner Ann Yang for evaluation of possible seizure  She was born with Down's syndrome, lived in nursing facility for many years, she moved to current home with Ann Yang since August 2015, there was no reported history of seizure, at baseline, she has limited verbal ability, unsteady gait, able to feed herself, put on clothes with help.  She had 2 events, first one was in November 14 2014, she was at her day program, sitting the chair, she suddenly slumped over, loss of consciousness, lasting for a few minutes, followed by positive and confusion, EMS was called, vital signs was fine, she was not taken to the hospital  Second witnessed event was in December 02 2014, while walking in the department store, she suddenly stare into the space, slumped over, there was no body jerking movement, lasted about few minutes, with bowel and bladder incontinence, post event, she cried, remained confused for about 30 minutes.  Ann Yang also mentioned some staring episode.  REVIEW OF SYSTEMS: Full 14 system review of systems performed and notable only for easy bruising, allergy, passing out  ALLERGIES: No Known Allergies  HOME MEDICATIONS: Current Outpatient Prescriptions  Medication Sig Dispense Refill  . ALPRAZolam (XANAX) 0.5 MG tablet Take 0.5 mg by mouth 2 (two) times daily.    Marland Kitchen levothyroxine (SYNTHROID, LEVOTHROID) 50 MCG tablet Take 50 mcg by mouth daily.    Marland Kitchen loratadine (CLARITIN) 10 MG tablet Take by  mouth.    . Olopatadine HCl 0.2 % SOLN Apply to eye.    . polyethylene glycol powder (GLYCOLAX/MIRALAX) powder Take 17 g by mouth.    . risperiDONE (RISPERDAL) 2 MG tablet Take 2 mg by mouth at bedtime.     . sertraline (ZOLOFT) 100 MG tablet Take 100 mg by mouth at bedtime.     . traZODone (DESYREL) 50 MG tablet Take by mouth.    . triamcinolone cream (KENALOG) 0.5 % Mix one inch of cream in palm of head with unscented lotion and rub into affected area twice a day as needed.       PAST MEDICAL HISTORY: Past Medical History  Diagnosis Date  . Thyroid disease   . Down's syndrome     PAST SURGICAL HISTORY: No past surgical history on file.  FAMILY HISTORY: No family history on file.  SOCIAL HISTORY:  History   Social History  . Marital Status: Single    Spouse Name: N/A  . Number of Children: N/A  . Years of Education: N/A   Occupational History  . Not on file.   Social History Main Topics  . Smoking status: Never Smoker   . Smokeless tobacco: Not on file  . Alcohol Use: No  . Drug Use: No  . Sexual Activity: No   Other Topics Concern  . Not on file   Social History Narrative  . No narrative on file     PHYSICAL EXAM   There were no vitals filed for this visit.  Not recorded  There is no height or weight on file to calculate BMI.  PHYSICAL EXAMNIATION:  Gen: NAD, conversant, well nourised, obese, well groomed                     Cardiovascular: Regular rate rhythm, no peripheral edema, warm, nontender. Eyes: Conjunctivae clear without exudates or hemorrhage Neck: Supple, no carotid bruise. Pulmonary: Clear to auscultation bilaterally   NEUROLOGICAL EXAM:  MENTAL STATUS: Speech/cognitive: Limited verbal ability, follow commands, typical Down's face  CRANIAL NERVES: CN II: Pupils were equal round reactive to light,  CN III, IV, VI: extraocular movement are normal. No ptosis. CN V: Facial sensation is intact to pinprick in all 3 divisions  bilaterally. Corneal responses are intact.  CN VII: Face is symmetric with normal eye closure and smile. CN VIII: Hearing is normal to rubbing fingers CN IX, X: Palate elevates symmetrically. Phonation is normal. CN XI: Head turning and shoulder shrug are intact CN XII: Tongue is midline with normal movements and no atrophy.  MOTOR: Moving arms and legs without difficulties  REFLEXES: Present and symmetric   SENSORY:  Not reliable  COORDINATION: No dysmetria  GAIT/STANCE: Wide base, unsteady gait.   DIAGNOSTIC DATA (LABS, IMAGING, TESTING) - I reviewed patient records, labs, notes, testing and imaging myself where available.  Lab Results  Component Value Date   WBC 6.9 07/19/2012   HGB 13.8 07/19/2012   HCT 39.6 07/19/2012   MCV 96.8 07/19/2012   PLT 109* 07/19/2012      Component Value Date/Time   NA 137 07/19/2012 1514   K 3.7 07/19/2012 1514   CL 99 07/19/2012 1514   CO2 23 07/19/2012 1514   GLUCOSE 222* 07/19/2012 1514   BUN 12 07/19/2012 1514   CREATININE 0.77 07/19/2012 1514   CALCIUM 8.9 07/19/2012 1514   GFRNONAA >90 07/19/2012 1514   GFRAA >90 07/19/2012 1514   Lab Results  Component Value Date   CHOL 161 01/25/2007   HDL 42 01/25/2007   LDLCALC 86 01/25/2007   TRIG 163* 01/25/2007   CHOLHDL 3.8 Ratio 01/25/2007    ASSESSMENT AND PLAN  Ann Yang is a 39 y.o. female    1.  Down's  syndrome  2.  Seizure, CAT scan of the brain, EEG, starting lamotrigine ER, titrating to 100 mg every night, document all event    Levert Feinstein, M.D. Ph.D.  [facility] [address] [place], [place] [address] Ph: [phone] Fax: [phone]

## 2014-12-26 ENCOUNTER — Ambulatory Visit (INDEPENDENT_AMBULATORY_CARE_PROVIDER_SITE_OTHER): Payer: Medicare Other

## 2014-12-26 DIAGNOSIS — R569 Unspecified convulsions: Secondary | ICD-10-CM

## 2014-12-26 DIAGNOSIS — Q909 Down syndrome, unspecified: Secondary | ICD-10-CM

## 2014-12-26 NOTE — Procedures (Signed)
   HISTORY: 39 years old female, with history of Down syndrome, presented with possible seizure.  TECHNIQUE:  16 channel EEG was performed based on standard 10-16 international system. One channel was dedicated to EKG, which has demonstrates normal sinus rhythm of beats per minutes.  Upon awakening, the posterior background activity was well-developed, in alpha range, 10 Hz,  reactive to eye opening and closure. There was frequent bilateral frontal dominant smaller amplitude, beta range activity   There was no evidence of epileptiform discharge.There was occasionally P3,T5 sharp transient.     Photic stimulation was performed, which induced a symmetric photic driving.  Hyperventilation was performed, there was no abnormality elicit.  No sleep was achieved.  CONCLUSION: This is a  normal awake EEG.  There is no electrodiagnostic evidence of epileptiform discharges, occasional appearance of P3, T5 sharp transient of unknown clinical significance.

## 2014-12-27 ENCOUNTER — Telehealth: Payer: Self-pay | Admitting: *Deleted

## 2014-12-27 NOTE — Telephone Encounter (Signed)
I have spoken with [facility] provider today and per Dr. Terrace Arabia, advised that EEG was normal.  She verbalized understanding of same/fim

## 2014-12-27 NOTE — Telephone Encounter (Signed)
-----   Message from Levert Feinstein, MD sent at 12/27/2014 11:10 AM EDT ----- Please call patient for normal EEG

## 2014-12-31 DIAGNOSIS — R569 Unspecified convulsions: Secondary | ICD-10-CM | POA: Diagnosis not present

## 2015-01-02 ENCOUNTER — Other Ambulatory Visit: Payer: Self-pay | Admitting: Diagnostic Neuroimaging

## 2015-01-02 DIAGNOSIS — Q909 Down syndrome, unspecified: Secondary | ICD-10-CM

## 2015-01-02 DIAGNOSIS — G40209 Localization-related (focal) (partial) symptomatic epilepsy and epileptic syndromes with complex partial seizures, not intractable, without status epilepticus: Secondary | ICD-10-CM

## 2015-01-03 ENCOUNTER — Telehealth: Payer: Self-pay | Admitting: Neurology

## 2015-01-03 NOTE — Telephone Encounter (Signed)
Spoke to [employer] ([facility] caregiver on [employer]) - she is aware of CT results.

## 2015-01-03 NOTE — Telephone Encounter (Signed)
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 2. No acute findings.

## 2015-02-13 ENCOUNTER — Ambulatory Visit (INDEPENDENT_AMBULATORY_CARE_PROVIDER_SITE_OTHER): Payer: Medicare Other | Admitting: Neurology

## 2015-02-13 ENCOUNTER — Encounter: Payer: Self-pay | Admitting: Neurology

## 2015-02-13 VITALS — BP 102/62 | HR 74 | Ht <= 58 in | Wt 104.0 lb

## 2015-02-13 DIAGNOSIS — Q909 Down syndrome, unspecified: Secondary | ICD-10-CM

## 2015-02-13 DIAGNOSIS — R569 Unspecified convulsions: Secondary | ICD-10-CM

## 2015-02-13 MED ORDER — LAMOTRIGINE ER 100 MG PO TB24: 200.0000 mg | ORAL_TABLET | Freq: Every day | ORAL | Status: DC

## 2015-02-13 NOTE — Progress Notes (Signed)
Chief Complaint  Patient presents with  . Down Syndrome    She is here with her [facility] provider, [facility].  She is taking her Lamictal XR  at bedtime and her seizure activity has decreased in frequency.  [employer]'s care coordinator, at her day program, has reported that she is still having staring spells at school.      PATIENT: Ann Yang DOB: 1975-09-15  Chief Complaint  Patient presents with  . Down Syndrome    She is here with her [facility] provider, [facility].  She is taking her Lamictal XR  at bedtime and her seizure activity has decreased in frequency.  Ann Yang's care coordinator, at her day program, has reported that she is still having staring spells at school.    HISTORICAL  Ann Yang is a 39 years old right-handed female, accompanied by her [facility] provider [facility], seen in refer from her primary care nurse practitioner Elizabeth Palau for evaluation of possible seizure in December 13 2014  She was born with Down's syndrome, lived in nursing facility for many years, she moved to current home with Meshell since August 2015, there was no reported history of seizure, at baseline, she has limited verbal ability, unsteady gait, able to feed herself, put on clothes with help.  She had 2 events, first one was in November 14 2014, she was at her day program, sitting the chair, she suddenly slumped over, loss of consciousness, lasting for a few minutes, followed by positive and confusion, EMS was called, vital signs was fine, she was not taken to the hospital  Second witnessed event was in December 02 2014, while walking in the department store, she suddenly stare into the space, slumped over, there was no body jerking movement, lasted about few minutes, with bowel and bladder incontinence, post event, she cried, remained confused for about 30 minutes.  Meshell also mentioned some staring episode.  UPDATE Sep 28th 2016: She was started on Lamotrigine xr  qhs, before that she  was having multiple episodses, staring, occasionally generalized seizure in a week, now only one small, staring spells once a week, there was no significant side effect noticed I have personally reviewed CAT scan of the brain in August 2016, enlarged fourth ventricle, partial agenesis of corpus callosum, no acute lesions, EEG was normal  REVIEW OF SYSTEMS: Full 14 system review of systems performed and notable only for bruise easily, seizure  ALLERGIES: No Known Allergies  HOME MEDICATIONS: Current Outpatient Prescriptions  Medication Sig Dispense Refill  . ALPRAZolam (XANAX) 0.5 MG tablet Take 0.5 mg by mouth 2 (two) times daily.    Marland Kitchen levothyroxine (SYNTHROID, LEVOTHROID) 50 MCG tablet Take 50 mcg by mouth daily.    Marland Kitchen loratadine (CLARITIN) 10 MG tablet Take by mouth.    . Olopatadine HCl 0.2 % SOLN Apply to eye.    . polyethylene glycol powder (GLYCOLAX/MIRALAX) powder Take 17 g by mouth.    . risperiDONE (RISPERDAL) 2 MG tablet Take 2 mg by mouth at bedtime.     . sertraline (ZOLOFT) 100 MG tablet Take 100 mg by mouth at bedtime.     . traZODone (DESYREL) 50 MG tablet Take by mouth.    . triamcinolone cream (KENALOG) 0.5 % Mix one inch of cream in palm of head with unscented lotion and rub into affected area twice a day as needed.       PAST MEDICAL HISTORY: Past Medical History  Diagnosis Date  . Thyroid disease   .  Down's syndrome     PAST SURGICAL HISTORY: Past Surgical History  Procedure Laterality Date  . No past surgery      FAMILY HISTORY: No family history on file.  SOCIAL HISTORY:  Social History   Social History  . Marital Status: Single    Spouse Name: N/A  . Number of Children: 0  . Years of Education: N/A   Occupational History  . Disabled    Social History Main Topics  . Smoking status: Never Smoker   . Smokeless tobacco: Not on file  . Alcohol Use: No  . Drug Use: No  . Sexual Activity: No   Other Topics Concern  . Not on file   Social  History Narrative   Lives with [facility] provider ([facility]).   Right-handed.   No caffeine use.     PHYSICAL EXAM   Filed Vitals:   02/13/15 1603  BP: 102/62  Pulse: 74  Height: 4' (1.219 m)  Weight: 104 lb (47.174 kg)    Not recorded      Body mass index is 31.75 kg/(m^2).  PHYSICAL EXAMNIATION:  Gen: NAD, conversant, well nourised, obese, well groomed                     Cardiovascular: Regular rate rhythm, no peripheral edema, warm, nontender. Eyes: Conjunctivae clear without exudates or hemorrhage Neck: Supple, no carotid bruise. Pulmonary: Clear to auscultation bilaterally   NEUROLOGICAL EXAM:  MENTAL STATUS: Speech/cognitive: Limited verbal ability, follow commands, typical Down's face  CRANIAL NERVES: CN II: Pupils were equal round reactive to light,  CN III, IV, VI: extraocular movement are normal. No ptosis. CN V: Facial sensation is intact to pinprick in all 3 divisions bilaterally. Corneal responses are intact.  CN VII: Face is symmetric with normal eye closure and smile. CN VIII: Hearing is normal to rubbing fingers CN IX, X: Palate elevates symmetrically. Phonation is normal. CN XI: Head turning and shoulder shrug are intact CN XII: Tongue is midline with normal movements and no atrophy.  MOTOR: Moving arms and legs without difficulties  REFLEXES: Present and symmetric   SENSORY:  Not reliable  COORDINATION: No dysmetria  GAIT/STANCE: Wide base, unsteady gait.   DIAGNOSTIC DATA (LABS, IMAGING, TESTING) - I reviewed patient records, labs, notes, testing and imaging myself where available.  Lab Results  Component Value Date   WBC 6.9 07/19/2012   HGB 13.8 07/19/2012   HCT 39.6 07/19/2012   MCV 96.8 07/19/2012   PLT 109* 07/19/2012      Component Value Date/Time   NA 137 07/19/2012 1514   K 3.7 07/19/2012 1514   CL 99 07/19/2012 1514   CO2 23 07/19/2012 1514   GLUCOSE 222* 07/19/2012 1514   BUN 12 07/19/2012 1514    CREATININE 0.77 07/19/2012 1514   CALCIUM 8.9 07/19/2012 1514   GFRNONAA >90 07/19/2012 1514   GFRAA >90 07/19/2012 1514   Lab Results  Component Value Date   CHOL 161 01/25/2007   HDL 42 01/25/2007   LDLCALC 86 01/25/2007   TRIG 163* 01/25/2007   CHOLHDL 3.8 Ratio 01/25/2007    ASSESSMENT AND PLAN  Ann Yang is a 39 y.o. female    Down's  syndrome  Epilepsy  Responded and tolerate lamotrigine XR 100 mg every night well, continue has occasionally staring spells  Increased lamotrigine xr 100 mg 2 tablets every night  Return to clinic with nurse practitioner in 3-4 months   Levert Feinstein, M.D.  Ph.D.  [facility] [address], [address] [place], [place] [address] Ph: [phone] Fax: [phone]

## 2015-02-18 ENCOUNTER — Telehealth: Payer: Self-pay | Admitting: *Deleted

## 2015-02-18 ENCOUNTER — Telehealth: Payer: Self-pay | Admitting: Neurology

## 2015-02-18 NOTE — Telephone Encounter (Signed)
Phone call from [facility] [phone] regarding LamoTRIgine (LAMICTAL XR) 100 MG TB24. Dr Terrace Arabia recently increased from 1 per night to 2 per night. Pharmacy states insurance is refusing this medication and recommended patient to call our office. Meshell advised they did go ahead and refilled the 1 per night medication and pharmacist advised to go ahead and give pt 2 however medication will run out in 15 days. Pharmacy is [facility] on [address].

## 2015-02-18 NOTE — Telephone Encounter (Signed)
I contacted Medicaid.  Spoke with Rob.  He said they are unable to conduct prior auth, and we would need to contact [facility] Ref # [id number] We do not have [employer] ins on file for this patient.  I called the pharmacy to obtain info.  Her ID # is [id number].  I contacted [employer] and provided clinical info.  They indicate no prior auth is required Ref # [id number].  I called the pharmacy back and they clarified this Rx did go through without any issues.

## 2015-02-18 NOTE — Telephone Encounter (Signed)
Form at the front desk ready for pick up

## 2015-03-31 ENCOUNTER — Emergency Department (HOSPITAL_COMMUNITY): Payer: Medicare Other

## 2015-03-31 ENCOUNTER — Encounter (HOSPITAL_COMMUNITY): Payer: Self-pay | Admitting: *Deleted

## 2015-03-31 ENCOUNTER — Inpatient Hospital Stay (HOSPITAL_COMMUNITY)
Admission: EM | Admit: 2015-03-31 | Discharge: 2015-04-01 | DRG: 178 | Disposition: A | Discharge status: Home / Self Care | Payer: Medicare Other | Attending: Internal Medicine | Admitting: Internal Medicine

## 2015-03-31 ENCOUNTER — Encounter (HOSPITAL_COMMUNITY): Payer: Self-pay

## 2015-03-31 ENCOUNTER — Emergency Department (INDEPENDENT_AMBULATORY_CARE_PROVIDER_SITE_OTHER)
Admission: EM | Admit: 2015-03-31 | Discharge: 2015-03-31 | Disposition: A | Discharge status: Other Acute Inpatient Hospital | Payer: Medicare Other | Source: Home / Self Care

## 2015-03-31 DIAGNOSIS — T18128A Food in esophagus causing other injury, initial encounter: Secondary | ICD-10-CM

## 2015-03-31 DIAGNOSIS — Q898 Other specified congenital malformations: Secondary | ICD-10-CM

## 2015-03-31 DIAGNOSIS — R6250 Unspecified lack of expected normal physiological development in childhood: Secondary | ICD-10-CM | POA: Diagnosis present

## 2015-03-31 DIAGNOSIS — K222 Esophageal obstruction: Secondary | ICD-10-CM

## 2015-03-31 DIAGNOSIS — R1110 Vomiting, unspecified: Secondary | ICD-10-CM | POA: Diagnosis present

## 2015-03-31 DIAGNOSIS — Y92099 Unspecified place in other non-institutional residence as the place of occurrence of the external cause: Secondary | ICD-10-CM | POA: Diagnosis not present

## 2015-03-31 DIAGNOSIS — R0989 Other specified symptoms and signs involving the circulatory and respiratory systems: Secondary | ICD-10-CM | POA: Diagnosis present

## 2015-03-31 DIAGNOSIS — N179 Acute kidney failure, unspecified: Secondary | ICD-10-CM

## 2015-03-31 DIAGNOSIS — E039 Hypothyroidism, unspecified: Secondary | ICD-10-CM | POA: Diagnosis present

## 2015-03-31 DIAGNOSIS — D696 Thrombocytopenia, unspecified: Secondary | ICD-10-CM | POA: Diagnosis present

## 2015-03-31 DIAGNOSIS — G40909 Epilepsy, unspecified, not intractable, without status epilepticus: Secondary | ICD-10-CM | POA: Diagnosis not present

## 2015-03-31 DIAGNOSIS — J690 Pneumonitis due to inhalation of food and vomit: Secondary | ICD-10-CM | POA: Diagnosis not present

## 2015-03-31 DIAGNOSIS — Q248 Other specified congenital malformations of heart: Secondary | ICD-10-CM

## 2015-03-31 DIAGNOSIS — R0902 Hypoxemia: Secondary | ICD-10-CM

## 2015-03-31 DIAGNOSIS — Q909 Down syndrome, unspecified: Secondary | ICD-10-CM | POA: Diagnosis not present

## 2015-03-31 HISTORY — DX: Depression, unspecified: F32.A

## 2015-03-31 HISTORY — DX: Unspecified convulsions: R56.9

## 2015-03-31 HISTORY — DX: Major depressive disorder, single episode, unspecified: F32.9

## 2015-03-31 HISTORY — DX: Headache, unspecified: R51.9

## 2015-03-31 HISTORY — DX: Anxiety disorder, unspecified: F41.9

## 2015-03-31 HISTORY — DX: Headache: R51

## 2015-03-31 LAB — COMPREHENSIVE METABOLIC PANEL WITH GFR
ALT: 14 U/L (ref 14–54)
AST: 21 U/L (ref 15–41)
Albumin: 3.8 g/dL (ref 3.5–5.0)
Alkaline Phosphatase: 68 U/L (ref 38–126)
Anion gap: 10 (ref 5–15)
BUN: 20 mg/dL (ref 6–20)
CO2: 24 mmol/L (ref 22–32)
Calcium: 8.7 mg/dL — ABNORMAL LOW (ref 8.9–10.3)
Chloride: 105 mmol/L (ref 101–111)
Creatinine, Ser: 1.06 mg/dL — ABNORMAL HIGH (ref 0.44–1.00)
GFR calc Af Amer: >60 mL/min
GFR calc non Af Amer: >60 mL/min
Glucose, Bld: 124 mg/dL — ABNORMAL HIGH (ref 65–99)
Potassium: 4 mmol/L (ref 3.5–5.1)
Sodium: 139 mmol/L (ref 135–145)
Total Bilirubin: 0.8 mg/dL (ref 0.3–1.2)
Total Protein: 6.8 g/dL (ref 6.5–8.1)

## 2015-03-31 LAB — CBC
HCT: 39.7 % (ref 36.0–46.0)
Hemoglobin: 13.5 g/dL (ref 12.0–15.0)
MCH: 33.1 pg (ref 26.0–34.0)
MCHC: 34 g/dL (ref 30.0–36.0)
MCV: 97.3 fL (ref 78.0–100.0)
Platelets: 112 10*3/uL — ABNORMAL LOW (ref 150–400)
RBC: 4.08 MIL/uL (ref 3.87–5.11)
RDW: 13.7 % (ref 11.5–15.5)
WBC: 12 10*3/uL — ABNORMAL HIGH (ref 4.0–10.5)

## 2015-03-31 MED ORDER — PIPERACILLIN-TAZOBACTAM 3.375 G IVPB 30 MIN
3.3750 g | Freq: Once | INTRAVENOUS | Status: AC
  Administered 2015-04-01: 3.375 g via INTRAVENOUS
  Filled 2015-03-31: qty 50

## 2015-03-31 MED ORDER — SODIUM CHLORIDE 0.9 % IV BOLUS (SEPSIS)
1000.0000 mL | Freq: Once | INTRAVENOUS | Status: AC
  Administered 2015-04-01: 1000 mL via INTRAVENOUS

## 2015-03-31 MED ORDER — GI COCKTAIL ~~LOC~~
30.0000 mL | Freq: Once | ORAL | Status: AC
  Administered 2015-03-31: 30 mL via ORAL

## 2015-03-31 MED ORDER — GI COCKTAIL ~~LOC~~
ORAL | Status: AC
  Filled 2015-03-31: qty 30

## 2015-03-31 NOTE — ED Notes (Signed)
Called Dr. Sibyl Parrhapman in regards to plan of care. No PO challenge at this time.

## 2015-03-31 NOTE — ED Notes (Addendum)
To ED from urgent care.  Onset at lunch today pt eating apple and got choked.  Has not been able to eat or drink anything since.  Urgent care reports pt could not keep water or GI cocktail down.  Pt in NAD.  Caregiver had to perform back thrust and large piece of apple expelled.  After choking had seizure.  Usual seizures pt zones out and loses bowels.  Last seizure in August, dx in July.

## 2015-03-31 NOTE — ED Provider Notes (Addendum)
CSN: [id number]     Arrival date & time 03/31/15  1853 History   None    Chief Complaint  Patient presents with  . Dysphagia   (Consider location/radiation/quality/duration/timing/severity/associated sxs/prior Treatment) Patient is a 39 y.o. female presenting with foreign body swallowed. The history is provided by the patient and a caregiver.  Swallowed Foreign Body This is a new problem. The current episode started 6 to 12 hours ago. The problem has not changed since onset.Associated symptoms comments: Choked on apple at lunch, also had seizure around 2pm, since has not been able to swallow liquids when challenged.. The symptoms are aggravated by swallowing.    Past Medical History  Diagnosis Date  . Thyroid disease   . Down's syndrome   . Seizures (HCC)   . Depression   . Anxiety   . Headache    Past Surgical History  Procedure Laterality Date  . No past surgery     History reviewed. No pertinent family history. Social History  Substance Use Topics  . Smoking status: Never Smoker   . Smokeless tobacco: None  . Alcohol Use: No   OB History    No data available     Review of Systems  Constitutional: Positive for appetite change. Negative for fever.  HENT: Positive for trouble swallowing.   Respiratory: Negative.   Cardiovascular: Negative.   Gastrointestinal: Negative.   All other systems reviewed and are negative.   Allergies  Review of patient's allergies indicates no known allergies.  Home Medications   Prior to Admission medications   Medication Sig Start Date End Date Taking? Authorizing Provider  ALPRAZolam Prudy Feeler(XANAX) 0.5 MG tablet Take 0.5 mg by mouth 2 (two) times daily.    Historical Provider, MD  amoxicillin-clavulanate (AUGMENTIN) 875-125 MG tablet Take 1 tablet by mouth 2 (two) times daily. 04/01/15   Jeralyn BennettEzequiel Zamora, MD  donepezil (ARICEPT) 10 MG tablet Take 10 mg by mouth at bedtime.    Historical Provider, MD  LamoTRIgine (LAMICTAL XR) 100 MG TB24  Take 2 tablets (200 mg total) by mouth at bedtime. 02/13/15   Levert FeinsteinYijun Yan, MD  levothyroxine (SYNTHROID, LEVOTHROID) 50 MCG tablet Take 50 mcg by mouth daily.    Historical Provider, MD  loratadine (CLARITIN) 10 MG tablet Take 10 mg by mouth at bedtime.  10/18/14 10/18/15  Historical Provider, MD  polyethylene glycol powder (GLYCOLAX/MIRALAX) powder Take 17 g by mouth daily as needed (constipation).  05/22/14   Historical Provider, MD  risperiDONE (RISPERDAL) 2 MG tablet Take 2 mg by mouth at bedtime.     Historical Provider, MD  sertraline (ZOLOFT) 100 MG tablet Take 100 mg by mouth at bedtime.     Historical Provider, MD  traZODone (DESYREL) 50 MG tablet Take 50 mg by mouth at bedtime as needed for sleep.     Historical Provider, MD  triamcinolone cream (KENALOG) 0.5 % Apply 1 application topically daily as needed (dry skin). Mix with unscented lotion    Historical Provider, MD   Meds Ordered and Administered this Visit   Medications  gi cocktail (Maalox,Lidocaine,Donnatal) (30 mLs Oral Given 03/31/15 1934)    BP 123/89 mmHg  Pulse 94  Temp(Src) 98.4 F (36.9 C) (Oral)  SpO2 97%  LMP 03/31/2015 No data found.   Physical Exam  Constitutional: She appears well-developed and well-nourished. She appears distressed.  HENT:  Mouth/Throat: Oropharynx is clear and moist.  Eyes: Pupils are equal, round, and reactive to light.  Neck: Normal range of motion. Neck supple.  Cardiovascular: Normal heart sounds and intact distal pulses.   Pulmonary/Chest: Effort normal and breath sounds normal.  Abdominal: Soft. Bowel sounds are normal. There is no tenderness.  Lymphadenopathy:    She has no cervical adenopathy.  Neurological: She is alert.  Skin: Skin is warm and dry.  Nursing note and vitals reviewed.   ED Course  Procedures (including critical care time)  Labs Review Labs Reviewed - No data to display  Imaging Review Dg Chest 1 View  04/01/2015  CLINICAL DATA:  Followup aspiration  pneumonia. EXAM: CHEST 1 VIEW COMPARISON:  03/31/2015 FINDINGS: Airspace consolidation the right lung base has increased from the prior study. There is milder streaky opacity at the left lung base which is similar to the prior exam. Remainder of the lungs is clear. Cardiac silhouette is borderline enlarged. No mediastinal or hilar masses or evidence of adenopathy. Skeletal structures are unremarkable. IMPRESSION: 1. Increased airspace consolidation of the right lung base consistent with mild worsening of pneumonia/ aspiration pneumonitis. 2. Opacity at the left lung base is stable, which may be due to additional pneumonia/aspiration pneumonitis, or atelectasis. Electronically Signed   By: Amie Portland M.D.   On: 04/01/2015 10:41   Dg Chest 2 View  03/31/2015  CLINICAL DATA:  Choked on piece of apple. EXAM: CHEST  2 VIEW COMPARISON:  07/19/2012 FINDINGS: Patient is rotated to the left. Lungs are adequately inflated with patchy bibasilar opacification right worse than left likely pneumonia. No evidence of effusion. Cardiomediastinal silhouette and remainder the exam is within normal. IMPRESSION: Patchy bibasilar opacification right worse than left likely a pneumonia. Electronically Signed   By: Elberta Fortis M.D.   On: 03/31/2015 22:03     Visual Acuity Review  Right Eye Distance:   Left Eye Distance:   Bilateral Distance:    Right Eye Near:   Left Eye Near:    Bilateral Near:         MDM   1. Esophageal obstruction due to food impaction    Sent for eval of poss esophageal obstr following choking on apple, now unable to swallow liquids.    Ann Hoff, MD 03/31/15 1930  Ann Hoff, MD 03/31/15 4098  Ann Hoff, MD 04/01/15 [phone]

## 2015-03-31 NOTE — ED Provider Notes (Signed)
Arrival Date & Time: 03/31/15 & 1941 History  No chief complaint on file.  HPI Eugene Gaviaancy A Picker is a 39 y.o. female who presents today after concerning episode this afternoon approximately around 2 PM when patient was eating apple and patient choked causing gagging and issues with handling of secretions. Patient had no episodes of turning blue pain for air and patient was able to cough up apple after caregiver gave several back thumps. Patient endorsed no symptomatic endorsements except for mild pain with swallowing. Patient had short seizure episode after choking that lasted only 1-3 minutes that required no medications and per caregiver was her normal typical seizure. Patient had normal return to baseline and was acting appropriate except for abnormal handling of secretions. Decision was made for patient to be brought to urgent care they evaluated patient and were concerned for esophageal impaction therefore [facility]. Patient has had no further concerning seizure episodes or choking or gagging however has had continued issues with secretions.  Past Medical History  I reviewed & agree with nursing's documentation of PMHx, PSHx, SHx & FHx. Past Medical History  Diagnosis Date  . Thyroid disease   . Down's syndrome   . Seizures [facility](HCC)    Past Surgical History  Procedure Laterality Date  . No past surgery     Social History   Social History  . Marital Status: Single    Spouse Name: N/A  . Number of Children: 0  . Years of Education: N/A   Occupational History  . Disabled    Social History Main Topics  . Smoking status: Never Smoker   . Smokeless tobacco: None  . Alcohol Use: No  . Drug Use: No  . Sexual Activity: No   Other Topics Concern  . None   Social History Narrative   Lives with [facility] provider ([facility]).   Right-handed.   No caffeine use.   History reviewed. No pertinent family history.  Review  of Systems   Complete Review of Systems obtained and is negative except as stated in HPI.  Allergies  Review of patient's allergies indicates no known allergies.  Home Medications   Prior to Admission medications   Medication Sig Start Date End Date Taking? Authorizing Provider  ALPRAZolam Prudy Feeler(XANAX) 0.5 MG tablet Take 0.5 mg by mouth 2 (two) times daily.   Yes Historical Provider, MD  donepezil (ARICEPT) 10 MG tablet Take 10 mg by mouth at bedtime.   Yes Historical Provider, MD  LamoTRIgine (LAMICTAL XR) 100 MG TB24 Take 2 tablets (200 mg total) by mouth at bedtime. 02/13/15  Yes Levert FeinsteinYijun Yan, MD  levothyroxine (SYNTHROID, LEVOTHROID) 50 MCG tablet Take 50 mcg by mouth daily.   Yes Historical Provider, MD  loratadine (CLARITIN) 10 MG tablet Take 10 mg by mouth at bedtime.  10/18/14 10/18/15 Yes Historical Provider, MD  polyethylene glycol powder (GLYCOLAX/MIRALAX) powder Take 17 g by mouth daily as needed (constipation).  05/22/14  Yes Historical Provider, MD  risperiDONE (RISPERDAL) 2 MG tablet Take 2 mg by mouth at bedtime.    Yes Historical Provider, MD  sertraline (ZOLOFT) 100 MG tablet Take 100 mg by mouth at bedtime.    Yes Historical Provider, MD  traZODone (DESYREL) 50 MG tablet Take 50 mg by mouth at bedtime as needed for sleep.    Yes Historical Provider, MD  triamcinolone cream (KENALOG) 0.5 % Apply 1 application topically daily as needed (dry skin). Mix with unscented lotion  Yes Historical Provider, MD    Physical Exam  BP 104/60 mmHg  Pulse 70  Temp(Src) 98.9 F (37.2 C) (Oral)  Resp 18  SpO2 92%  LMP 03/31/2015 Physical Exam Vitals & Nursing notes reviewed. CONST: female, in NAD. Appears WD/WN & stated age. HEAD: AT. EYES: PERRL. No conjunctival injection & lids symmetrical. ENMT: External nose & ears atraumatic. MM moist. Oropharynx w/o swelling or exudates. Patient handling secretions without signs of foreign body per oropharynx. NECK: Supple, w/o meningismus. Trachea  midline. JVD & stridor absent. CVS: S1/S2 audible w/o gallops or obvious murmurs. Peripheral pulses 2+ & equal in all extremities. Cap refill < 2 seconds. RESP: Respiratory effort normal. Rhonchi in right middle and lower lobes without evidence of wheeze bilaterally left lung fields clear. GI: NT/ND, w/o rebound or guarding. BS audible. MSK: Extremities w/o ttp, deformity or cyanosis. Joints w/o swelling. SKIN: Warm & dry. No rash or lesion. NEURO: CN II-XIII grossly intact. Sensation & Strength w/o deficit. W/o clonus. PSYCH: Mood & affect appropriate. Cooperative.  ED Course  Procedures  Labs Review Labs Reviewed  CBC - Abnormal; Notable for the following:    WBC 12.0 (*)    Platelets 112 (*)    All other components within normal limits  COMPREHENSIVE METABOLIC PANEL - Abnormal; Notable for the following:    Glucose, Bld 124 (*)    Creatinine, Ser 1.06 (*)    Calcium 8.7 (*)    All other components within normal limits    Imaging Review Dg Chest 2 View  03/31/2015  CLINICAL DATA:  Choked on piece of apple. EXAM: CHEST  2 VIEW COMPARISON:  07/19/2012 FINDINGS: Patient is rotated to the left. Lungs are adequately inflated with patchy bibasilar opacification right worse than left likely pneumonia. No evidence of effusion. Cardiomediastinal silhouette and remainder the exam is within normal. IMPRESSION: Patchy bibasilar opacification right worse than left likely a pneumonia. Electronically Signed   By: Elberta Fortis M.D.   On: 03/31/2015 22:03   Laboratory and Imaging results were personally reviewed by myself and used in the medical decision making of this patient's treatment and disposition.  EKG Interpretation  EKG Interpretation  Date/Time:    Ventricular Rate:    PR Interval:    QRS Duration:   QT Interval:    QTC Calculation:   R Axis:     Text Interpretation:        MDM  KAYIA BILLINGER is a 40 y.o. female with H&P as above. ED clinical course as  follows:  Patient's event concerning for esophageal impaction originally presents with mild drooling per oral cavity however is not tripoding no signs of respiratory distress or increased work of breathing. patient shortly after being evaluated has resolution of secretions per oral cavity. Patient endorses no syncopal endorses except for mild cough and chest x-ray two-view, personal visualization reveals concerns for right middle lobe and right lower lobe airspace infiltrates consistent with aspiration. Patient's oxygen saturation requiring 2 L to maintain sats above 90% at this time patient still is without signs of toxicity hypertension altered mental status or respiratory decompensation. Patient given intravenous Zosyn due to mildly elevated leukocytosis and is otherwise well-appearing. I obtained consultation with hospitalist service for concerns of aspiration pneumonia and given patient's complex medical history of Down syndrome and epilepsy patient will require inpatient evaluation and continued management at this time. We discussed patient's ED clinical course laboratory work and imaging and have agreed upon patient's placement at this time. Patient  appropriate for admission.  Disposition: Admit  Clinical Impression: No diagnosis found. Patient care discussed with Dr. Verdie Mosher, who oversaw their evaluation & treatment & voiced agreement. House Officer: Jonette Eva, MD, Emergency Medicine.  Jonette Eva, MD 04/01/15 0010  Lavera Guise, MD 04/01/15 [phone]

## 2015-03-31 NOTE — ED Notes (Signed)
Did   Not  Tolerate       Gi  Cocktail or  water

## 2015-03-31 NOTE — ED Notes (Signed)
Discussed plan of care with Dr. Sibyl Parrhapman. No medications at this time, but to have 2 view chest xray orderded.

## 2015-03-31 NOTE — ED Notes (Signed)
PT  GOT  CHOKED  EARLIER    TODAY  WHILE     EATING  AN  APPLE        -  SHE  HAD  A  SEIZURE   ABOUT  5  HOURS  AGO  AS  WELL       Pt     At this  Time  Is  Sitting  Upright  On   Exam table      No seizure  Activity  And  In no   Acute  Distress  Caregiver at  Bedside

## 2015-03-31 NOTE — ED Notes (Signed)
Dr. Chapman at the bedside. 

## 2015-04-01 ENCOUNTER — Encounter (HOSPITAL_COMMUNITY): Payer: Self-pay | Admitting: *Deleted

## 2015-04-01 ENCOUNTER — Observation Stay (HOSPITAL_COMMUNITY): Payer: Medicare Other

## 2015-04-01 DIAGNOSIS — K222 Esophageal obstruction: Secondary | ICD-10-CM

## 2015-04-01 DIAGNOSIS — Q909 Down syndrome, unspecified: Secondary | ICD-10-CM | POA: Diagnosis not present

## 2015-04-01 DIAGNOSIS — T18108A Unspecified foreign body in esophagus causing other injury, initial encounter: Secondary | ICD-10-CM

## 2015-04-01 DIAGNOSIS — R5600 Simple febrile convulsions: Secondary | ICD-10-CM

## 2015-04-01 DIAGNOSIS — R1111 Vomiting without nausea: Secondary | ICD-10-CM

## 2015-04-01 DIAGNOSIS — Q249 Congenital malformation of heart, unspecified: Secondary | ICD-10-CM | POA: Diagnosis not present

## 2015-04-01 DIAGNOSIS — D696 Thrombocytopenia, unspecified: Secondary | ICD-10-CM | POA: Diagnosis not present

## 2015-04-01 DIAGNOSIS — G40909 Epilepsy, unspecified, not intractable, without status epilepticus: Secondary | ICD-10-CM | POA: Diagnosis not present

## 2015-04-01 DIAGNOSIS — N179 Acute kidney failure, unspecified: Secondary | ICD-10-CM | POA: Diagnosis not present

## 2015-04-01 DIAGNOSIS — Y92099 Unspecified place in other non-institutional residence as the place of occurrence of the external cause: Secondary | ICD-10-CM | POA: Diagnosis not present

## 2015-04-01 DIAGNOSIS — R1110 Vomiting, unspecified: Secondary | ICD-10-CM | POA: Diagnosis present

## 2015-04-01 DIAGNOSIS — Q898 Other specified congenital malformations: Secondary | ICD-10-CM | POA: Diagnosis not present

## 2015-04-01 DIAGNOSIS — R0989 Other specified symptoms and signs involving the circulatory and respiratory systems: Secondary | ICD-10-CM | POA: Diagnosis present

## 2015-04-01 DIAGNOSIS — J690 Pneumonitis due to inhalation of food and vomit: Principal | ICD-10-CM | POA: Diagnosis present

## 2015-04-01 DIAGNOSIS — T18128A Food in esophagus causing other injury, initial encounter: Secondary | ICD-10-CM | POA: Diagnosis not present

## 2015-04-01 DIAGNOSIS — E039 Hypothyroidism, unspecified: Secondary | ICD-10-CM

## 2015-04-01 DIAGNOSIS — R6250 Unspecified lack of expected normal physiological development in childhood: Secondary | ICD-10-CM | POA: Diagnosis not present

## 2015-04-01 DIAGNOSIS — R1112 Projectile vomiting: Secondary | ICD-10-CM

## 2015-04-01 DIAGNOSIS — Q248 Other specified congenital malformations of heart: Secondary | ICD-10-CM

## 2015-04-01 MED ORDER — ENOXAPARIN SODIUM 30 MG/0.3ML ~~LOC~~ SOLN
30.0000 mg | SUBCUTANEOUS | Status: DC
  Administered 2015-04-01: 30 mg via SUBCUTANEOUS
  Filled 2015-04-01: qty 0.3

## 2015-04-01 MED ORDER — LAMOTRIGINE ER 100 MG PO TB24: 200.0000 mg | ORAL_TABLET | Freq: Every day | ORAL | Status: DC

## 2015-04-01 MED ORDER — SODIUM CHLORIDE 0.9 % IV SOLN
INTRAVENOUS | Status: DC
  Administered 2015-04-01: 02:00:00 via INTRAVENOUS

## 2015-04-01 MED ORDER — SODIUM CHLORIDE 0.9 % IJ SOLN
3.0000 mL | Freq: Two times a day (BID) | INTRAMUSCULAR | Status: DC
  Administered 2015-04-01: 3 mL via INTRAVENOUS

## 2015-04-01 MED ORDER — TRAZODONE HCL 50 MG PO TABS: 50.0000 mg | ORAL_TABLET | Freq: Every evening | ORAL | Status: DC | PRN

## 2015-04-01 MED ORDER — RISPERIDONE 2 MG PO TABS
2.0000 mg | ORAL_TABLET | Freq: Every day | ORAL | Status: DC
  Administered 2015-04-01: 2 mg via ORAL
  Filled 2015-04-01 (×2): qty 1

## 2015-04-01 MED ORDER — DONEPEZIL HCL 10 MG PO TABS
10.0000 mg | ORAL_TABLET | Freq: Every day | ORAL | Status: DC
  Administered 2015-04-01: 10 mg via ORAL
  Filled 2015-04-01: qty 1

## 2015-04-01 MED ORDER — LEVOTHYROXINE SODIUM 50 MCG PO TABS
50.0000 ug | ORAL_TABLET | Freq: Every day | ORAL | Status: DC
  Administered 2015-04-01: 50 ug via ORAL
  Filled 2015-04-01: qty 1

## 2015-04-01 MED ORDER — LAMOTRIGINE 100 MG PO TABS
100.0000 mg | ORAL_TABLET | Freq: Two times a day (BID) | ORAL | Status: DC
  Administered 2015-04-01: 100 mg via ORAL
  Filled 2015-04-01 (×4): qty 1

## 2015-04-01 MED ORDER — SERTRALINE HCL 100 MG PO TABS
100.0000 mg | ORAL_TABLET | Freq: Every day | ORAL | Status: DC
  Administered 2015-04-01: 100 mg via ORAL
  Filled 2015-04-01: qty 1

## 2015-04-01 MED ORDER — AMOXICILLIN-POT CLAVULANATE 875-125 MG PO TABS: 1.0000 | ORAL_TABLET | Freq: Two times a day (BID) | ORAL | Status: DC

## 2015-04-01 MED ORDER — LORATADINE 10 MG PO TABS
10.0000 mg | ORAL_TABLET | Freq: Every day | ORAL | Status: DC
  Administered 2015-04-01: 10 mg via ORAL
  Filled 2015-04-01: qty 1

## 2015-04-01 MED ORDER — ALPRAZOLAM 0.5 MG PO TABS
0.5000 mg | ORAL_TABLET | Freq: Two times a day (BID) | ORAL | Status: DC
  Administered 2015-04-01 (×2): 0.5 mg via ORAL
  Filled 2015-04-01 (×2): qty 1

## 2015-04-01 MED ORDER — PIPERACILLIN-TAZOBACTAM 3.375 G IVPB 30 MIN
3.3750 g | Freq: Three times a day (TID) | INTRAVENOUS | Status: DC
  Administered 2015-04-01: 3.375 g via INTRAVENOUS
  Filled 2015-04-01 (×4): qty 50

## 2015-04-01 NOTE — ED Notes (Signed)
Concerns for patient fleeing if left alone, will require safety sitter per home care staff.

## 2015-04-01 NOTE — ED Notes (Signed)
House coverage notified that safety sitter is needed. Patient being transferred to 6east room 7, please send safety sitter there. Spoke to [place].

## 2015-04-01 NOTE — ED Notes (Signed)
Dr. Sibyl Parrhapman called as visitor has concern for missed home medications. MD at the bedside.

## 2015-04-01 NOTE — Progress Notes (Addendum)
Eugene GaviaNancy A Snapp to be D/C'd Home per MD order.  Discussed prescriptions and follow up appointments with the patient. Prescriptions given to patient, medication list explained in detail. Pt verbalized understanding.    Medication List    TAKE these medications        ALPRAZolam 0.5 MG tablet  Commonly known as:  XANAX  Take 0.5 mg by mouth 2 (two) times daily.     amoxicillin-clavulanate 875-125 MG tablet  Commonly known as:  AUGMENTIN  Take 1 tablet by mouth 2 (two) times daily.     donepezil 10 MG tablet  Commonly known as:  ARICEPT  Take 10 mg by mouth at bedtime.     LamoTRIgine 100 MG Tb24  Commonly known as:  LAMICTAL XR  Take 2 tablets (200 mg total) by mouth at bedtime.     levothyroxine 50 MCG tablet  Commonly known as:  SYNTHROID, LEVOTHROID  Take 50 mcg by mouth daily.     loratadine 10 MG tablet  Commonly known as:  CLARITIN  Take 10 mg by mouth at bedtime.     polyethylene glycol powder powder  Commonly known as:  GLYCOLAX/MIRALAX  Take 17 g by mouth daily as needed (constipation).     risperiDONE 2 MG tablet  Commonly known as:  RISPERDAL  Take 2 mg by mouth at bedtime.     sertraline 100 MG tablet  Commonly known as:  ZOLOFT  Take 100 mg by mouth at bedtime.     traZODone 50 MG tablet  Commonly known as:  DESYREL  Take 50 mg by mouth at bedtime as needed for sleep.     triamcinolone cream 0.5 %  Commonly known as:  KENALOG  Apply 1 application topically daily as needed (dry skin). Mix with unscented lotion        Filed Vitals:   04/01/15 0945  BP: 119/55  Pulse: 79  Temp: 98.3 F (36.8 C)  Resp: 18    Skin clean, dry and intact without evidence of skin break down, no evidence of skin tears noted. IV catheter discontinued intact. Site without signs and symptoms of complications. Dressing and pressure applied. Pt denies pain at this time. No complaints noted.  An After Visit Summary was printed and given to the patient. Patient ambulated off  unit with caregiver, and D/C home via private auto.  Janeann ForehandLuke Kam Kushnir BSN, RN

## 2015-04-01 NOTE — Discharge Summary (Signed)
Physician Discharge Summary  Eugene Gaviaancy A Chavero ZOX:[id number] DOB: [birth year] DOA: 03/31/2015  PCP: Elizabeth PalauANDERSON,TERESA, FNP  Admit date: 03/31/2015 Discharge date: 04/01/2015  Time spent: 35 minutes  Recommendations for Outpatient Follow-up:  1. Please follow-up on respiratory status, patient was admitted after choking on food, likely developing aspiration pneumonitis. She was discharged on empiric antibiotic therapy with Augmentin.   Discharge Diagnoses:  Principal Problem:   Esophageal obstruction due to food impaction Active Problems:   Hypothyroidism   Down's syndrome   Aspiration pneumonia (HCC)   Emesis   Congenital heart valve abnormality   Acute kidney injury [facility](HCC)   Discharge Condition: stable  Diet recommendation: soft diet  Filed Weights   04/01/15 0118  Weight: 46.993 kg (103 lb 9.6 oz)    History of present illness:  [name] a 39 year old female with Down syndrome, and seizures; who presents after an episode of choking this afternoon around 2 PM. Her caregiver who provides her history as she is unable to do so for self due to developmental delay as a result of her Down syndrome. Patient had been eating cubes of apple during the event in question and had began gag. Her caregiver states [name] notes patient often needs prompting to eat slower/ chew so that she does not choke. However, patient was continuing to gag with expressions of pain and discomfort, and seen not able to handle her secretions. The caregiver immediately perform a finger sweep of the mouth however was nothing there. Then, she preformed the Heimlich maneuver in which a large chunk of apple was dislodge from Ann Yang's throat. Thereafter there is a short seizure episode in which lasted 1-3 minutes requiring no medications. Caregiver describes a staring off spell which patient normally is incontinent of bowel. Patient appeared to be back to baseline, however had some abnormal handling of secretions. They  tried to feed her again and she had projectile emesis. At that time they decided to bring her to urgent care where patient was evaluated given a GI cocktail with improved mobilization of secretions. Chest x-ray showed signs of a pneumonia  Hospital Course:  Ms Ann Yang is a pleasant 39 year old female with a past medical history of down syndrome, history of seizure disorder, presented to the emergency department on 03/31/2015 after having a choking episode on a piece of apple. It appears that she was given the Heimlich maneuver by her caregiver. Initial workup included a two-view chest x-ray that revealed patchy bibasilar opacification right greater than left. She was started on empiric IV antibiotic therapy with IV Zosyn. Patient remained stable overnight, did not require supplemental oxygen. By the following morning she was satting 95% on room air. Staff reported that she tolerated her breakfast. Given hemodynamic stability she was discharged back to her home under the care of her caregiver on 04/01/2015. She was given a six-day course of Augmentin.    Discharge Exam: Filed Vitals:   04/01/15 0645  BP: 101/57  Pulse: 71  Temp: 98.5 F (36.9 C)  Resp: 20    General: no acute distress, arousable, calm, cooperative, pleasant, she is in no acute distress and is nontoxic appearing  Cardiovascular: 3-6 systolic murmur, normal S1-S2 no bilateral lower extremity edema Respiratory: normal respiratory effort, lungs overall were clear to auscultation bilaterally  Abdomen: Soft nontender nondistended Extremities: No edema  Discharge Instructions   Discharge Instructions    Call MD for:  difficulty breathing, headache or visual disturbances    Complete by:  As directed  Call MD for:  extreme fatigue    Complete by:  As directed      Call MD for:  hives    Complete by:  As directed      Call MD for:  persistant dizziness or light-headedness    Complete by:  As directed      Call MD for:   persistant nausea and vomiting    Complete by:  As directed      Call MD for:  redness, tenderness, or signs of infection (pain, swelling, redness, odor or green/yellow discharge around incision site)    Complete by:  As directed      Call MD for:  severe uncontrolled pain    Complete by:  As directed      Call MD for:  temperature >100.4    Complete by:  As directed      Call MD for:    Complete by:  As directed      Diet - low sodium heart healthy    Complete by:  As directed      Increase activity slowly    Complete by:  As directed           Current Discharge Medication List    START taking these medications   Details  amoxicillin-clavulanate (AUGMENTIN) 875-125 MG tablet Take 1 tablet by mouth 2 (two) times daily. Qty: 12 tablet, Refills: 0      CONTINUE these medications which have NOT CHANGED   Details  ALPRAZolam (XANAX) 0.5 MG tablet Take 0.5 mg by mouth 2 (two) times daily.    donepezil (ARICEPT) 10 MG tablet Take 10 mg by mouth at bedtime.    LamoTRIgine (LAMICTAL XR) 100 MG TB24 Take 2 tablets (200 mg total) by mouth at bedtime. Qty: 60 tablet, Refills: 11    levothyroxine (SYNTHROID, LEVOTHROID) 50 MCG tablet Take 50 mcg by mouth daily.    loratadine (CLARITIN) 10 MG tablet Take 10 mg by mouth at bedtime.     polyethylene glycol powder (GLYCOLAX/MIRALAX) powder Take 17 g by mouth daily as needed (constipation).     risperiDONE (RISPERDAL) 2 MG tablet Take 2 mg by mouth at bedtime.     sertraline (ZOLOFT) 100 MG tablet Take 100 mg by mouth at bedtime.     traZODone (DESYREL) 50 MG tablet Take 50 mg by mouth at bedtime as needed for sleep.     triamcinolone cream (KENALOG) 0.5 % Apply 1 application topically daily as needed (dry skin). Mix with unscented lotion       No Known Allergies Follow-up Information    Follow up with ANDERSON,TERESA, FNP In 1 week.   Specialty:  Nurse Practitioner   Contact information:   [address] [place] [employer] [place] [address] [phone]        The results of significant diagnostics from this hospitalization (including imaging, microbiology, ancillary and laboratory) are listed below for reference.    Significant Diagnostic Studies: Dg Chest 2 View  03/31/2015  CLINICAL DATA:  Choked on piece of apple. EXAM: CHEST  2 VIEW COMPARISON:  07/19/2012 FINDINGS: Patient is rotated to the left. Lungs are adequately inflated with patchy bibasilar opacification right worse than left likely pneumonia. No evidence of effusion. Cardiomediastinal silhouette and remainder the exam is within normal. IMPRESSION: Patchy bibasilar opacification right worse than left likely a pneumonia. Electronically Signed   By: Elberta Fortis M.D.   On: 03/31/2015 22:03    Microbiology: No results found for  this or any previous visit (from the past 240 hour(s)).   Labs: Basic Metabolic Panel:  Recent Labs Lab 03/31/15 2304  NA 139  K 4.0  CL 105  CO2 24  GLUCOSE 124*  BUN 20  CREATININE 1.06*  CALCIUM 8.7*   Liver Function Tests:  Recent Labs Lab 03/31/15 2304  AST 21  ALT 14  ALKPHOS 68  BILITOT 0.8  PROT 6.8  ALBUMIN 3.8   No results for input(s): LIPASE, AMYLASE in the last 168 hours. No results for input(s): AMMONIA in the last 168 hours. CBC:  Recent Labs Lab 03/31/15 2304  WBC 12.0*  HGB 13.5  HCT 39.7  MCV 97.3  PLT 112*   Cardiac Enzymes: No results for input(s): CKTOTAL, CKMB, CKMBINDEX, TROPONINI in the last 168 hours. BNP: BNP (last 3 results) No results for input(s): BNP in the last 8760 hours.  ProBNP (last 3 results) No results for input(s): PROBNP in the last 8760 hours.  CBG: No results for input(s): GLUCAP in the last 168 hours.     SignedJeralyn Bennett  [facility] 04/01/2015, 10:02 AM

## 2015-04-01 NOTE — H&P (Signed)
[facility] History and Physical  Ann Yang:[id number] DOB: [birth year] DOA: 03/31/2015  Referring physician: ED PCP: Elizabeth Palau, FNP   Chief Complaint: A piece of apple got stuck in her throat  HPI:  [name] a 39 year old female with Down syndrome, and seizures; who presents after an episode of choking this afternoon around 2 PM. Her caregiver who provides her history as she is unable to do so for self due to developmental delay as a result of her Down syndrome. Patient had been eating cubes of apple during the event in question and had began gag. Her caregiver states [name] notes patient often needs prompting to eat slower/ chew so that she does not choke. However, patient was continuing to gag with expressions of pain and discomfort, and seen not able to handle her secretions. The caregiver immediately perform a finger sweep of the mouth however was nothing there. Then, she preformed the Heimlich maneuver in which a large chunk of apple was dislodge from Mrs. Yang's throat. Thereafter there is a short seizure episode in which lasted 1-3 minutes requiring no medications. Caregiver describes a staring off spell which patient normally is incontinent of bowel. Patient appeared to be back to baseline, however had some abnormal handling of secretions. They tried to feed her again and she had projectile emesis. At that time they decided to bring her to urgent care where patient was evaluated given a GI cocktail with improved mobilization of secretions. Chest x-ray showed signs of a pneumonia.   Review of Systems  Constitutional: Negative for fever and chills.  HENT: Negative for congestion.   Eyes: Negative for pain and discharge.  Respiratory: Positive for cough. Negative for sputum production.   Cardiovascular: Negative for chest pain and palpitations.  Gastrointestinal: Positive for vomiting and diarrhea.  Genitourinary: Negative for dysuria and urgency.  Musculoskeletal:  Negative for myalgias and neck pain.  Skin: Negative for itching and rash.  Neurological: Positive for seizures. Negative for tremors, sensory change, focal weakness and headaches.  Endo/Heme/Allergies: Negative for polydipsia. Does not bruise/bleed easily.  Psychiatric/Behavioral: Negative for substance abuse. The patient is nervous/anxious.        Past Medical History  Diagnosis Date  . Thyroid disease   . Down's syndrome   . Seizures (HCC)   . Depression   . Anxiety   . Headache      Past Surgical History  Procedure Laterality Date  . No past surgery        Social History:  reports that she has never smoked. She does not have any smokeless tobacco history on file. She reports that she does not drink alcohol or use illicit drugs.  where does patient live--home and with whom if at home? With caregiver and 2 sons  Can patient participate in ADLs? Yes with assistance  No Known Allergies  History reviewed. No pertinent family history.     Prior to Admission medications   Medication Sig Start Date End Date Taking? Authorizing Provider  ALPRAZolam Prudy Feeler) 0.5 MG tablet Take 0.5 mg by mouth 2 (two) times daily.   Yes Historical Provider, MD  donepezil (ARICEPT) 10 MG tablet Take 10 mg by mouth at bedtime.   Yes Historical Provider, MD  LamoTRIgine (LAMICTAL XR) 100 MG TB24 Take 2 tablets (200 mg total) by mouth at bedtime. 02/13/15  Yes Levert Feinstein, MD  levothyroxine (SYNTHROID, LEVOTHROID) 50 MCG tablet Take 50 mcg by mouth daily.   Yes Historical Provider, MD  loratadine (CLARITIN) 10 MG  tablet Take 10 mg by mouth at bedtime.  10/18/14 10/18/15 Yes Historical Provider, MD  polyethylene glycol powder (GLYCOLAX/MIRALAX) powder Take 17 g by mouth daily as needed (constipation).  05/22/14  Yes Historical Provider, MD  risperiDONE (RISPERDAL) 2 MG tablet Take 2 mg by mouth at bedtime.    Yes Historical Provider, MD  sertraline (ZOLOFT) 100 MG tablet Take 100 mg by mouth at bedtime.     Yes Historical Provider, MD  traZODone (DESYREL) 50 MG tablet Take 50 mg by mouth at bedtime as needed for sleep.    Yes Historical Provider, MD  triamcinolone cream (KENALOG) 0.5 % Apply 1 application topically daily as needed (dry skin). Mix with unscented lotion   Yes Historical Provider, MD     Physical Exam: Filed Vitals:   03/31/15 2314 03/31/15 2320 03/31/15 2345 04/01/15 0118  BP:   104/60 104/52  Pulse: 81 76 70 77  Temp:    98.7 F (37.1 C)  TempSrc:    Oral  Resp:    18  Weight:    46.993 kg (103 lb 9.6 oz)  SpO2: 92% 93% 92% 90%     Constitutional: Vital signs reviewed. Patient is a well-developed and well-nourished in no acute distress and cooperative with exam. Alert  Head: Normocephalic and atraumatic  Ear: TM normal bilaterally  Mouth: No foreign body present patient able to tolerate secretions at this time   Eyes: PERRL, EOMI, conjunctivae normal, No scleral icterus.  Neck: Supple, Trachea midline normal ROM, No JVD, mass, thyromegaly, or carotid bruit present.  Cardiovascular: RRR, S1 normal, S2 normal, +murmur, pulses symmetric and intact bilaterally  Pulmonary/Chest: Patient breathes shallowly and as rales that be heard more so on the right side. no significant wheezes are appreciated Abdominal: Soft. Non-tender, non-distended, bowel sounds are normal, no masses, organomegaly, or guarding present.  GU: no CVA tenderness Musculoskeletal: No joint deformities, erythema, or stiffness, ROM full and no nontender Ext: no edema and no cyanosis, pulses palpable bilaterally (DP and PT)  Hematology: no cervical, inginal, or axillary adenopathy.  Neurological: A&O x3, Strenght is normal and symmetric bilaterally, cranial nerve II-XII are grossly intact, no focal motor deficit, sensory intact to light touch bilaterally.  Skin: Warm, dry and intact. No rash, cyanosis, or clubbing.  Psychiatric:  mood and affect are appropriate. Judgement/ memory  altered    Data Review    Micro Results No results found for this or any previous visit (from the past 240 hour(s)).  Radiology Reports Dg Chest 2 View  03/31/2015  CLINICAL DATA:  Choked on piece of apple. EXAM: CHEST  2 VIEW COMPARISON:  07/19/2012 FINDINGS: Patient is rotated to the left. Lungs are adequately inflated with patchy bibasilar opacification right worse than left likely pneumonia. No evidence of effusion. Cardiomediastinal silhouette and remainder the exam is within normal. IMPRESSION: Patchy bibasilar opacification right worse than left likely a pneumonia. Electronically Signed   By: Elberta Fortisaniel  Boyle M.D.   On: 03/31/2015 22:03     CBC  Recent Labs Lab 03/31/15 2304  WBC 12.0*  HGB 13.5  HCT 39.7  PLT 112*  MCV 97.3  MCH 33.1  MCHC 34.0  RDW 13.7    Chemistries   Recent Labs Lab 03/31/15 2304  NA 139  K 4.0  CL 105  CO2 24  GLUCOSE 124*  BUN 20  CREATININE 1.06*  CALCIUM 8.7*  AST 21  ALT 14  ALKPHOS 68  BILITOT 0.8   ------------------------------------------------------------------------------------------------------------------ estimated creatinine clearance is  33.2 mL/min (by C-G formula based on Cr of 1.06). ------------------------------------------------------------------------------------------------------------------ No results for input(s): HGBA1C in the last 72 hours. ------------------------------------------------------------------------------------------------------------------ No results for input(s): CHOL, HDL, LDLCALC, TRIG, CHOLHDL, LDLDIRECT in the last 72 hours. ------------------------------------------------------------------------------------------------------------------ No results for input(s): TSH, T4TOTAL, T3FREE, THYROIDAB in the last 72 hours.  Invalid input(s): FREET3 ------------------------------------------------------------------------------------------------------------------ No results for input(s): VITAMINB12, FOLATE, FERRITIN, TIBC,  IRON, RETICCTPCT in the last 72 hours.  Coagulation profile No results for input(s): INR, PROTIME in the last 168 hours.  No results for input(s): DDIMER in the last 72 hours.  Cardiac Enzymes No results for input(s): CKMB, TROPONINI, MYOGLOBIN in the last 168 hours.  Invalid input(s): CK ------------------------------------------------------------------------------------------------------------------ Invalid input(s): POCBNP   CBG: No results for input(s): GLUCAP in the last 168 hours.     EKG: Ordered   Assessment/Plan Principal Problem:    Esophageal obstruction due to food impaction: Now appears resolved. Caregiver states patient ate a apple cube without chewing and it likely got lodged in her esophagus. A large piece of apple was removed with caregiver giving patient Heimlich. However, patient likely had a residual obstruction for which GI cocktail appears to have relieved. Patient comfortable able to clear secretions on physical exam. -Food must be chopped for patient eating -Monitor  if symptoms recur will likely need further eval with barium swallow +/-  ENT or GI consult  Aspiration pneumonia (HCC) secondary to emesis revolving around esophageal obstruction due to food impaction. Chest x-ray positive for patchy infiltrates on x-ray suggestive of pneumonia. Patient with mild elevation in leukocyte count. Patient given 1 dose of Zosyn in ED. -continue zoysn and change to po if warranted in am  Emesis: Now resolved. Appears previous obstruction has resolved. -Zofran prn if needed  Suspected AKI Acute. baseline creatinine was approximately 0.77 creatinine back in 2014, acutely elevated at 1.06 on presentation BUN 20. Suspect some mild dehydration as patient has not had anything to eat or drink since the choking incident. -IV fluid -repeat BMP in am  Down's syndrome with developmental delay: Patient needs assistance and prompting with most of ADLs. -Sitter to bedside for  patient safety -Continue Aricept, risperodone  Anxiety - continue Zoloft and prn ativan  Seizure: Caregiver describes it as a staring off for several minutes in which patient usually is incontinent of bowel. Patient has not had any recurrences since earlier in afternoon. Follows with neurology for this issue. -Seizure precautions -Continue Lamictal  Thrombocytopenia: chronic. platelet count 112 today previously 109 back in 2014. Patient has no active source of bleeding. -Continue to monitor  Hypothyroidism -Continue levothyroxine  Congenital heart abnormality: Stable. Caretaker notes a defect for which patient is under the care of cardiologist with routine care.  -EKG     Code Status:   full Family Communication: bedside Disposition Plan: admit   Total time spent 55 minutes.Greater than 50% of this time was spent in counseling, explanation of diagnosis, planning of further management, and coordination of care  Clydie Braun [facility] Pager [phone]  If 7PM-7AM, please contact night-coverage [email or web address] Password [email or web address] 04/01/2015, 1:27 AM

## 2015-04-09 DIAGNOSIS — G40909 Epilepsy, unspecified, not intractable, without status epilepticus: Secondary | ICD-10-CM | POA: Insufficient documentation

## 2015-04-17 ENCOUNTER — Telehealth: Payer: Self-pay | Admitting: Neurology

## 2015-04-17 NOTE — Telephone Encounter (Signed)
Left message on AFL's personal voicemail that she is correct in giving her Lamictal XR 100mg , 2 tablets at bedtime.  Her last office note reflects this increase and the new prescription sent to the pharmacy, the day of her appointment, states these directions.  Left our number to call back with any further questions.

## 2015-04-17 NOTE — Telephone Encounter (Signed)
Pt's AFL provider called sts LamoTRIgine (LAMICTAL XR) 100 MG TB24 she thought it was 100mg  at bedtime the last time it was increased. She was looking over paperwork and it showed 200mg  at bedtime. pls call to clarify

## 2015-05-15 ENCOUNTER — Ambulatory Visit: Payer: Medicare Other | Admitting: Nurse Practitioner

## 2015-05-21 ENCOUNTER — Telehealth: Payer: Self-pay | Admitting: *Deleted

## 2015-05-21 ENCOUNTER — Ambulatory Visit: Payer: Medicare Other | Admitting: Nurse Practitioner

## 2015-05-21 ENCOUNTER — Ambulatory Visit (INDEPENDENT_AMBULATORY_CARE_PROVIDER_SITE_OTHER): Payer: Medicare Other | Admitting: Nurse Practitioner

## 2015-05-21 ENCOUNTER — Encounter: Payer: Self-pay | Admitting: Nurse Practitioner

## 2015-05-21 VITALS — BP 86/46 | HR 69 | Ht <= 58 in | Wt 98.2 lb

## 2015-05-21 DIAGNOSIS — Q909 Down syndrome, unspecified: Secondary | ICD-10-CM | POA: Diagnosis not present

## 2015-05-21 DIAGNOSIS — G40909 Epilepsy, unspecified, not intractable, without status epilepticus: Secondary | ICD-10-CM | POA: Diagnosis not present

## 2015-05-21 MED ORDER — LAMOTRIGINE ER 100 MG PO TB24: 200.0000 mg | ORAL_TABLET | Freq: Every day | ORAL | Status: DC

## 2015-05-21 NOTE — Telephone Encounter (Signed)
Pt called and switched her appt time from 4p to 2p 05/21/15.

## 2015-05-21 NOTE — Progress Notes (Signed)
I have reviewed and agreed above plan. 

## 2015-05-21 NOTE — Telephone Encounter (Signed)
LMVM for Ann Yang at group home to call back about moving appt to earlier time today.  (1400).

## 2015-05-21 NOTE — Progress Notes (Signed)
[facility]  PATIENT: Ann Yang DOB: [birth year]   REASON FOR VISIT: Follow-up for seizure disorder, Downs syndrome HISTORY FROM: Caregiver    HISTORY OF PRESENT ILLNESS:Ann Yang is a 40 years old right-handed female, accompanied by her [facility] provider [facility], seen in refer from her primary care nurse practitioner Elizabeth Palau for evaluation of possible seizure in December 13 2014  She was born with Down's syndrome, lived in nursing facility for many years, she moved to current home with Meshell since August 2015, there was no reported history of seizure, at baseline, she has limited verbal ability, unsteady gait, able to feed herself, put on clothes with help. She had 2 events, first one was in November 14 2014, she was at her day program, sitting the chair, she suddenly slumped over, loss of consciousness, lasting for a few minutes, followed by positive and confusion, EMS was called, vital signs was fine, she was not taken to the hospital  Second witnessed event was in December 02 2014, while walking in the department store, she suddenly stare into the space, slumped over, there was no body jerking movement, lasted about few minutes, with bowel and bladder incontinence, post event, she cried, remained confused for about 30 minutes. Meshell also mentioned some staring episode.  UPDATE Sep 28th 2016: She was started on Lamotrigine xr 100mg  qhs, before that she was having multiple episodses, staring, occasionally generalized seizure in a week, now only one small, staring spells once a week, there was no significant side effect noticed I have personally reviewed CAT scan of the brain in August 2016, enlarged fourth ventricle, partial agenesis of corpus callosum, no acute lesions, EEG was normal UPDATE 05/21/15 Ms. Freida Yang, 40 year old female returns for follow-up. She has a history of seizure disorder and Down's syndrome. When last seen by Dr. Terrace Arabia in  September her lamotrigine was increased to 100 mg extended release 2 at bedtime. She has not had further seizure activity. She returns for reevaluation, she lives in alternative family living situation.    REVIEW OF SYSTEMS: Full 14 system review of systems performed and notable only for those listed, all others are neg:  Constitutional: neg  Cardiovascular: neg Ear/Nose/Throat: neg  Skin: neg Eyes: neg Respiratory: neg Gastroitestinal: neg  Hematology/Lymphatic: neg  Endocrine: neg Musculoskeletal:neg Allergy/Immunology: neg Neurological: neg Psychiatric: neg Sleep : neg   ALLERGIES: No Known Allergies  HOME MEDICATIONS: Outpatient Prescriptions Prior to Visit  Medication Sig Dispense Refill  . ALPRAZolam (XANAX) 0.5 MG tablet Take 0.5 mg by mouth 2 (two) times daily.    Marland Kitchen donepezil (ARICEPT) 10 MG tablet Take 10 mg by mouth at bedtime.    . LamoTRIgine (LAMICTAL XR) 100 MG TB24 Take 2 tablets (200 mg total) by mouth at bedtime. 60 tablet 11  . levothyroxine (SYNTHROID, LEVOTHROID) 50 MCG tablet Take 50 mcg by mouth daily.    Marland Kitchen loratadine (CLARITIN) 10 MG tablet Take 10 mg by mouth at bedtime.     . polyethylene glycol powder (GLYCOLAX/MIRALAX) powder Take 17 g by mouth daily as needed (constipation).     . risperiDONE (RISPERDAL) 2 MG tablet Take 2 mg by mouth at bedtime.     . sertraline (ZOLOFT) 100 MG tablet Take 100 mg by mouth at bedtime.     . traZODone (DESYREL) 50 MG tablet Take 50 mg by mouth at bedtime as needed for sleep.     Marland Kitchen amoxicillin-clavulanate (AUGMENTIN) 875-125 MG tablet Take 1 tablet by mouth 2 (  two) times daily. (Patient not taking: Reported on 05/21/2015) 12 tablet 0  . triamcinolone cream (KENALOG) 0.5 % Apply 1 application topically daily as needed (dry skin). Reported on 05/21/2015     No facility-administered medications prior to visit.    PAST MEDICAL HISTORY: Past Medical History  Diagnosis Date  . Thyroid disease   . Down's syndrome   .  Seizures (HCC)   . Depression   . Anxiety   . Headache     PAST SURGICAL HISTORY: Past Surgical History  Procedure Laterality Date  . No past surgery      FAMILY HISTORY: History reviewed. No pertinent family history.  SOCIAL HISTORY: Social History   Social History  . Marital Status: Single    Spouse Name: N/A  . Number of Children: 0  . Years of Education: N/A   Occupational History  . Disabled    Social History Main Topics  . Smoking status: Never Smoker   . Smokeless tobacco: Not on file  . Alcohol Use: No  . Drug Use: No  . Sexual Activity: No   Other Topics Concern  . Not on file   Social History Narrative   Lives with [facility] provider ([facility]).   Right-handed.   No caffeine use.     PHYSICAL EXAM  Filed Vitals:   05/21/15 1409  BP: 86/46  Pulse: 69  Height: 4' (1.219 m)  Weight: 98 lb 3.2 oz (44.543 kg)   Body mass index is 29.98 kg/(m^2). Gen: NAD, conversant, well nourised,  well groomed  Cardiovascular: Regular rate rhythm,  Neck: Supple, no carotid bruit. Pulmonary: Clear to auscultation bilaterally   NEUROLOGICAL EXAM:  MENTAL STATUS: Speech/cognitive: Limited verbal ability, follow commands, typical Down's face  CRANIAL NERVES: CN II: Pupils were equal round reactive to light,  CN III, IV, VI: extraocular movement are normal. No ptosis. CN V: Facial sensation is intact to pinprick in all 3 divisions bilaterally.   CN VII: Face is symmetric with normal eye closure and smile. CN VIII: Hearing is normal to rubbing fingers CN IX, X: Palate elevates symmetrically. Phonation is normal. CN XI: Head turning and shoulder shrug are intact CN XII: Tongue is midline with normal movements and no atrophy. MOTOR:Moving arms and legs without difficulties REFLEXES: Present and symmetric  SENSORY: Not reliable COORDINATION:Finger to nose finger No dysmetria GAIT/STANCE:Wide base, unsteady  gait.   DIAGNOSTIC DATA (LABS, IMAGING, TESTING) - I reviewed patient records, labs, notes, testing and imaging myself where available.  Lab Results  Component Value Date   WBC 12.0* 03/31/2015   HGB 13.5 03/31/2015   HCT 39.7 03/31/2015   MCV 97.3 03/31/2015   PLT 112* 03/31/2015      Component Value Date/Time   NA 139 03/31/2015 2304   K 4.0 03/31/2015 2304   CL 105 03/31/2015 2304   CO2 24 03/31/2015 2304   GLUCOSE 124* 03/31/2015 2304   BUN 20 03/31/2015 2304   CREATININE 1.06* 03/31/2015 2304   CALCIUM 8.7* 03/31/2015 2304   PROT 6.8 03/31/2015 2304   ALBUMIN 3.8 03/31/2015 2304   AST 21 03/31/2015 2304   ALT 14 03/31/2015 2304   ALKPHOS 68 03/31/2015 2304   BILITOT 0.8 03/31/2015 2304   GFRNONAA >60 03/31/2015 2304   GFRAA >60 03/31/2015 2304     ASSESSMENT AND PLAN  40 y.o. year old female  has a past medical history of Down's syndrome; Seizures (HCC); . here to follow-up. No seizure activity since last seen  Continue  lamotrigine XR at current dose 100mg  2 at hs RX to caregiver F/U in 6 months Nilda RiggsNancy Carolyn Chakita Mcgraw, [facility] DivisionGNP, William R Sharpe Jr HospitalBC, APRN  [facility] [address], [address] [place], [place] [address] [phone]

## 2015-05-21 NOTE — Patient Instructions (Signed)
Continue  lamotrigine XRat current dose RX to caregiver F/U in 6 months

## 2015-06-12 ENCOUNTER — Other Ambulatory Visit: Payer: Self-pay | Admitting: Nurse Practitioner

## 2015-06-12 MED ORDER — LAMOTRIGINE ER 100 MG PO TB24: 200.0000 mg | ORAL_TABLET | Freq: Every day | ORAL | Status: DC

## 2015-06-12 NOTE — Telephone Encounter (Signed)
Ann Yang with [facility] [phone] called inquiring if  LamoTRIgine (LAMICTAL XR) 100 MG TB24  RX can be written for qty of 62/mth. They blister pack her medication and some months have 31 days so a qty of 62/mth would cover those months.

## 2015-06-13 NOTE — Telephone Encounter (Signed)
Faxed received to [facility] in [place], [place]  06-12-15.

## 2015-06-13 NOTE — Telephone Encounter (Signed)
Faxed to

## 2015-06-18 ENCOUNTER — Other Ambulatory Visit: Payer: Self-pay | Admitting: Nurse Practitioner

## 2015-06-18 NOTE — Telephone Encounter (Signed)
Sheila/[facility] [phone] called on 06/12/15 to request LamoTRIgine (LAMICTAL XR) 100 MG TB24 quantity to be changed from 60 to 62 due to months with 31 days, patient would be short medication. Also requests Rx to dispense as written ([email or web address]), insurance won't pay for generic.

## 2015-06-18 NOTE — Telephone Encounter (Signed)
Had faxed printed prescription for generic lamictal for 62 tabs.  Now asking for BN lamictal?

## 2015-06-18 NOTE — Telephone Encounter (Signed)
ok 

## 2015-06-19 MED ORDER — LAMICTAL XR 100 MG PO TB24: 200.0000 mg | ORAL_TABLET | Freq: Every day | ORAL | Status: DC

## 2015-06-20 ENCOUNTER — Telehealth: Payer: Self-pay | Admitting: *Deleted

## 2015-06-20 MED ORDER — LAMICTAL XR 100 MG PO TB24: 200.0000 mg | ORAL_TABLET | Freq: Every day | ORAL | Status: DC

## 2015-06-20 NOTE — Telephone Encounter (Signed)
Ann Yang with [facility] in [place] is who pt is using for her pharmacy and not [facility].   I called and spoke to [place] at [facility] [phone] and told to cancel prescription with them.  New prescription sent to [facility]. Services.

## 2015-07-17 ENCOUNTER — Telehealth: Payer: Self-pay | Admitting: *Deleted

## 2015-07-17 NOTE — Telephone Encounter (Signed)
Dr. Terrace Arabia was the provider that placed her on XR. Please do PA.

## 2015-07-17 NOTE — Telephone Encounter (Addendum)
Received request for PA on lamictal XR.    I donot see that pt has been on anything else.  Do you want to change to lamictal  po bid?

## 2015-07-18 ENCOUNTER — Telehealth: Payer: Self-pay | Admitting: *Deleted

## 2015-07-18 NOTE — Telephone Encounter (Signed)
[facility], spoke to [place] who will fax over PA form, as cover my meds stated did not allow for this drug.

## 2015-07-18 NOTE — Telephone Encounter (Signed)
Per covermymeds generic lamictal XR  tabs does not require PA.  [facility] [place] [facility] this information.  [phone],  [phone].

## 2015-11-07 ENCOUNTER — Emergency Department (HOSPITAL_COMMUNITY): Payer: Medicare Other

## 2015-11-07 ENCOUNTER — Encounter (HOSPITAL_COMMUNITY): Payer: Self-pay | Admitting: Emergency Medicine

## 2015-11-07 ENCOUNTER — Inpatient Hospital Stay (HOSPITAL_COMMUNITY)
Admission: EM | Admit: 2015-11-07 | Discharge: 2015-11-15 | DRG: 870 | Disposition: A | Discharge status: Other Acute Inpatient Hospital | Payer: Medicare Other | Attending: Pulmonary Disease | Admitting: Pulmonary Disease

## 2015-11-07 DIAGNOSIS — J690 Pneumonitis due to inhalation of food and vomit: Secondary | ICD-10-CM | POA: Diagnosis present

## 2015-11-07 DIAGNOSIS — R0602 Shortness of breath: Secondary | ICD-10-CM | POA: Diagnosis present

## 2015-11-07 DIAGNOSIS — R1114 Bilious vomiting: Secondary | ICD-10-CM

## 2015-11-07 DIAGNOSIS — D696 Thrombocytopenia, unspecified: Secondary | ICD-10-CM | POA: Diagnosis present

## 2015-11-07 DIAGNOSIS — J189 Pneumonia, unspecified organism: Secondary | ICD-10-CM | POA: Diagnosis not present

## 2015-11-07 DIAGNOSIS — R6521 Severe sepsis with septic shock: Secondary | ICD-10-CM | POA: Diagnosis present

## 2015-11-07 DIAGNOSIS — Q909 Down syndrome, unspecified: Secondary | ICD-10-CM | POA: Diagnosis not present

## 2015-11-07 DIAGNOSIS — Z79899 Other long term (current) drug therapy: Secondary | ICD-10-CM

## 2015-11-07 DIAGNOSIS — I959 Hypotension, unspecified: Secondary | ICD-10-CM

## 2015-11-07 DIAGNOSIS — E873 Alkalosis: Secondary | ICD-10-CM | POA: Diagnosis present

## 2015-11-07 DIAGNOSIS — T18128A Food in esophagus causing other injury, initial encounter: Secondary | ICD-10-CM

## 2015-11-07 DIAGNOSIS — A047 Enterocolitis due to Clostridium difficile: Secondary | ICD-10-CM | POA: Diagnosis present

## 2015-11-07 DIAGNOSIS — Z452 Encounter for adjustment and management of vascular access device: Secondary | ICD-10-CM

## 2015-11-07 DIAGNOSIS — I509 Heart failure, unspecified: Secondary | ICD-10-CM | POA: Diagnosis present

## 2015-11-07 DIAGNOSIS — A419 Sepsis, unspecified organism: Secondary | ICD-10-CM | POA: Diagnosis present

## 2015-11-07 DIAGNOSIS — R509 Fever, unspecified: Secondary | ICD-10-CM | POA: Diagnosis present

## 2015-11-07 DIAGNOSIS — R739 Hyperglycemia, unspecified: Secondary | ICD-10-CM | POA: Diagnosis present

## 2015-11-07 DIAGNOSIS — N179 Acute kidney failure, unspecified: Secondary | ICD-10-CM | POA: Diagnosis present

## 2015-11-07 DIAGNOSIS — Q212 Atrioventricular septal defect: Secondary | ICD-10-CM

## 2015-11-07 DIAGNOSIS — F79 Unspecified intellectual disabilities: Secondary | ICD-10-CM | POA: Diagnosis present

## 2015-11-07 DIAGNOSIS — F329 Major depressive disorder, single episode, unspecified: Secondary | ICD-10-CM | POA: Diagnosis present

## 2015-11-07 DIAGNOSIS — Q211 Atrial septal defect: Secondary | ICD-10-CM | POA: Diagnosis not present

## 2015-11-07 DIAGNOSIS — Q210 Ventricular septal defect: Secondary | ICD-10-CM | POA: Diagnosis not present

## 2015-11-07 DIAGNOSIS — Q040 Congenital malformations of corpus callosum: Secondary | ICD-10-CM | POA: Diagnosis not present

## 2015-11-07 DIAGNOSIS — Z781 Physical restraint status: Secondary | ICD-10-CM | POA: Diagnosis not present

## 2015-11-07 DIAGNOSIS — E440 Moderate protein-calorie malnutrition: Secondary | ICD-10-CM

## 2015-11-07 DIAGNOSIS — E861 Hypovolemia: Secondary | ICD-10-CM | POA: Diagnosis present

## 2015-11-07 DIAGNOSIS — K222 Esophageal obstruction: Secondary | ICD-10-CM

## 2015-11-07 DIAGNOSIS — J9690 Respiratory failure, unspecified, unspecified whether with hypoxia or hypercapnia: Secondary | ICD-10-CM

## 2015-11-07 DIAGNOSIS — M21619 Bunion of unspecified foot: Secondary | ICD-10-CM

## 2015-11-07 DIAGNOSIS — R740 Nonspecific elevation of levels of transaminase and lactic acid dehydrogenase [LDH]: Secondary | ICD-10-CM | POA: Diagnosis present

## 2015-11-07 DIAGNOSIS — R1110 Vomiting, unspecified: Secondary | ICD-10-CM | POA: Diagnosis present

## 2015-11-07 DIAGNOSIS — R7989 Other specified abnormal findings of blood chemistry: Secondary | ICD-10-CM | POA: Diagnosis not present

## 2015-11-07 DIAGNOSIS — J9601 Acute respiratory failure with hypoxia: Secondary | ICD-10-CM

## 2015-11-07 DIAGNOSIS — R1310 Dysphagia, unspecified: Secondary | ICD-10-CM | POA: Diagnosis present

## 2015-11-07 DIAGNOSIS — T502X5A Adverse effect of carbonic-anhydrase inhibitors, benzothiadiazides and other diuretics, initial encounter: Secondary | ICD-10-CM | POA: Diagnosis present

## 2015-11-07 DIAGNOSIS — G40909 Epilepsy, unspecified, not intractable, without status epilepticus: Secondary | ICD-10-CM | POA: Diagnosis present

## 2015-11-07 DIAGNOSIS — J80 Acute respiratory distress syndrome: Secondary | ICD-10-CM | POA: Diagnosis not present

## 2015-11-07 DIAGNOSIS — J9600 Acute respiratory failure, unspecified whether with hypoxia or hypercapnia: Secondary | ICD-10-CM

## 2015-11-07 DIAGNOSIS — Q248 Other specified congenital malformations of heart: Secondary | ICD-10-CM

## 2015-11-07 DIAGNOSIS — E039 Hypothyroidism, unspecified: Secondary | ICD-10-CM | POA: Diagnosis present

## 2015-11-07 DIAGNOSIS — F419 Anxiety disorder, unspecified: Secondary | ICD-10-CM | POA: Diagnosis present

## 2015-11-07 DIAGNOSIS — I272 Other secondary pulmonary hypertension: Secondary | ICD-10-CM | POA: Diagnosis present

## 2015-11-07 DIAGNOSIS — D72818 Other decreased white blood cell count: Secondary | ICD-10-CM | POA: Diagnosis present

## 2015-11-07 DIAGNOSIS — Z4659 Encounter for fitting and adjustment of other gastrointestinal appliance and device: Secondary | ICD-10-CM

## 2015-11-07 LAB — CBC WITH DIFFERENTIAL/PLATELET
Basophils Absolute: 0 10*3/uL (ref 0.0–0.1)
Basophils Relative: 0 %
EOS ABS: 0 10*3/uL (ref 0.0–0.7)
Eosinophils Relative: 0 %
HEMATOCRIT: 41 % (ref 36.0–46.0)
HEMOGLOBIN: 13.9 g/dL (ref 12.0–15.0)
LYMPHS ABS: 0.5 10*3/uL — AB (ref 0.7–4.0)
Lymphocytes Relative: 8 %
MCH: 33.2 pg (ref 26.0–34.0)
MCHC: 33.9 g/dL (ref 30.0–36.0)
MCV: 97.9 fL (ref 78.0–100.0)
MONOS PCT: 5 %
Monocytes Absolute: 0.3 10*3/uL (ref 0.1–1.0)
NEUTROS ABS: 5.3 10*3/uL (ref 1.7–7.7)
NEUTROS PCT: 87 %
Platelets: 123 10*3/uL — ABNORMAL LOW (ref 150–400)
RBC: 4.19 MIL/uL (ref 3.87–5.11)
RDW: 13.7 % (ref 11.5–15.5)
WBC: 6.1 10*3/uL (ref 4.0–10.5)

## 2015-11-07 LAB — MRSA PCR SCREENING: MRSA by PCR: NEGATIVE

## 2015-11-07 LAB — BLOOD GAS, VENOUS
Acid-base deficit: 1.1 mmol/L (ref 0.0–2.0)
BICARBONATE: 26.1 meq/L — AB (ref 20.0–24.0)
FIO2: 1
O2 SAT: 40.8 %
PCO2 VEN: 56.1 mmHg — AB (ref 45.0–50.0)
PH VEN: 7.29 (ref 7.250–7.300)
Patient temperature: 98.6
TCO2: 24 mmol/L (ref 0–100)

## 2015-11-07 LAB — I-STAT BETA HCG BLOOD, ED (MC, WL, AP ONLY): I-stat hCG, quantitative: <5 m[IU]/mL (ref <5)

## 2015-11-07 LAB — COMPREHENSIVE METABOLIC PANEL
ALBUMIN: 4.1 g/dL (ref 3.5–5.0)
ALT: 19 U/L (ref 14–54)
AST: 31 U/L (ref 15–41)
Alkaline Phosphatase: 54 U/L (ref 38–126)
Anion gap: 9 (ref 5–15)
BUN: 31 mg/dL — ABNORMAL HIGH (ref 6–20)
CHLORIDE: 109 mmol/L (ref 101–111)
CO2: 26 mmol/L (ref 22–32)
CREATININE: 1.55 mg/dL — AB (ref 0.44–1.00)
Calcium: 8.8 mg/dL — ABNORMAL LOW (ref 8.9–10.3)
GFR calc non Af Amer: 41 mL/min — ABNORMAL LOW (ref >60)
GFR, EST AFRICAN AMERICAN: 47 mL/min — AB (ref >60)
GLUCOSE: 148 mg/dL — AB (ref 65–99)
Potassium: 4.7 mmol/L (ref 3.5–5.1)
SODIUM: 144 mmol/L (ref 135–145)
Total Bilirubin: 1.2 mg/dL (ref 0.3–1.2)
Total Protein: 7.8 g/dL (ref 6.5–8.1)

## 2015-11-07 LAB — I-STAT CG4 LACTIC ACID, ED: Lactic Acid, Venous: 3.75 mmol/L (ref 0.5–2.0)

## 2015-11-07 MED ORDER — HEPARIN SODIUM (PORCINE) 5000 UNIT/ML IJ SOLN
5000.0000 [IU] | Freq: Three times a day (TID) | INTRAMUSCULAR | Status: DC
  Administered 2015-11-08 – 2015-11-15 (×22): 5000 [IU] via SUBCUTANEOUS
  Filled 2015-11-07 (×22): qty 1

## 2015-11-07 MED ORDER — SERTRALINE HCL 100 MG PO TABS
100.0000 mg | ORAL_TABLET | Freq: Every day | ORAL | Status: DC
  Administered 2015-11-07: 100 mg via ORAL
  Filled 2015-11-07: qty 1

## 2015-11-07 MED ORDER — DONEPEZIL HCL 10 MG PO TABS
10.0000 mg | ORAL_TABLET | Freq: Every day | ORAL | Status: DC
  Administered 2015-11-07: 10 mg via ORAL
  Filled 2015-11-07: qty 1

## 2015-11-07 MED ORDER — LAMOTRIGINE ER 100 MG PO TB24: 200.0000 mg | ORAL_TABLET | Freq: Every day | ORAL | Status: DC

## 2015-11-07 MED ORDER — SODIUM CHLORIDE 0.9 % IV SOLN
INTRAVENOUS | Status: DC
  Administered 2015-11-07 – 2015-11-12 (×7): via INTRAVENOUS

## 2015-11-07 MED ORDER — ALPRAZOLAM 0.5 MG PO TABS
0.5000 mg | ORAL_TABLET | Freq: Two times a day (BID) | ORAL | Status: DC
  Administered 2015-11-07 – 2015-11-08 (×2): 0.5 mg via ORAL
  Filled 2015-11-07 (×2): qty 1

## 2015-11-07 MED ORDER — RISPERIDONE 2 MG PO TABS
2.0000 mg | ORAL_TABLET | Freq: Every day | ORAL | Status: DC
  Administered 2015-11-07: 2 mg via ORAL
  Filled 2015-11-07 (×2): qty 1

## 2015-11-07 MED ORDER — LAMOTRIGINE 100 MG PO TABS
100.0000 mg | ORAL_TABLET | Freq: Two times a day (BID) | ORAL | Status: DC
  Administered 2015-11-07 – 2015-11-08 (×2): 100 mg via ORAL
  Filled 2015-11-07 (×4): qty 1

## 2015-11-07 MED ORDER — LORATADINE 10 MG PO TABS
10.0000 mg | ORAL_TABLET | Freq: Every day | ORAL | Status: DC
  Administered 2015-11-07: 10 mg via ORAL
  Filled 2015-11-07: qty 1

## 2015-11-07 MED ORDER — SODIUM CHLORIDE 0.9 % IV BOLUS (SEPSIS)
500.0000 mL | Freq: Once | INTRAVENOUS | Status: AC
  Administered 2015-11-07: 500 mL via INTRAVENOUS

## 2015-11-07 MED ORDER — HALOPERIDOL LACTATE 5 MG/ML IJ SOLN
1.0000 mg | INTRAMUSCULAR | Status: DC | PRN
  Administered 2015-11-08: 1 mg via INTRAVENOUS
  Filled 2015-11-07 (×2): qty 1

## 2015-11-07 MED ORDER — DEXTROSE 5 % IV SOLN
500.0000 mg | Freq: Once | INTRAVENOUS | Status: AC
  Administered 2015-11-07: 500 mg via INTRAVENOUS
  Filled 2015-11-07: qty 500

## 2015-11-07 MED ORDER — TRAZODONE HCL 50 MG PO TABS: 50.0000 mg | ORAL_TABLET | Freq: Every evening | ORAL | Status: DC | PRN

## 2015-11-07 MED ORDER — DEXTROSE 5 % IV SOLN: 500.0000 mg | INTRAVENOUS | Status: DC

## 2015-11-07 MED ORDER — DEXTROSE 5 % IV SOLN
1.0000 g | Freq: Once | INTRAVENOUS | Status: AC
  Administered 2015-11-07: 1 g via INTRAVENOUS
  Filled 2015-11-07: qty 10

## 2015-11-07 MED ORDER — DEXTROSE 5 % IV SOLN: 1.0000 g | INTRAVENOUS | Status: DC

## 2015-11-07 MED ORDER — ALBUTEROL SULFATE (2.5 MG/3ML) 0.083% IN NEBU: 5.0000 mg | INHALATION_SOLUTION | Freq: Once | RESPIRATORY_TRACT | Status: DC

## 2015-11-07 MED ORDER — LEVOTHYROXINE SODIUM 50 MCG PO TABS
50.0000 ug | ORAL_TABLET | Freq: Every day | ORAL | Status: DC
  Administered 2015-11-08: 50 ug via ORAL
  Filled 2015-11-07: qty 1

## 2015-11-07 NOTE — H&P (Signed)
History and Physical    Ann Gaviaancy A Hutmacher UJW:[id number] DOB: [birth year] DOA: 11/07/2015  PCP: Elizabeth PalauANDERSON,TERESA, FNP  Patient coming from: Assisted living facility  Chief Complaint: Difficulty breathing after aspiration event  HPI: Ann Yang is a 40 y.o. female with medical history significant of MR, seizure disorder, anxiety, depression. Presenting to the hospital after aspiration event requiring Heimlich maneuver. Patient was evaluated and found to be hypoxic and subsequently presented to the hospital for further evaluation. Unable to obtain history secondary to advanced MR  ED Course: While in the ED patient was fluid bolused. Also had difficulty keeping nonrebreather on her face. Nursing reports patient would desaturate. Chest x-ray reported bibasilar infiltrates which could represent pneumonia  Review of Systems:  Unable to accurately assess secondary to advance MR/Down syndrome  Past Medical History  Diagnosis Date  . Thyroid disease   . Down's syndrome   . Seizures (HCC)   . Depression   . Anxiety   . Headache     Past Surgical History  Procedure Laterality Date  . No past surgery       reports that she has never smoked. She does not have any smokeless tobacco history on file. She reports that she does not drink alcohol or use illicit drugs.  No Known Allergies  Family history - Unable to obtain secondary to MR   Prior to Admission medications   Medication Sig Start Date End Date Taking? Authorizing Provider  ALPRAZolam Prudy Feeler(XANAX) 0.5 MG tablet Take 0.5 mg by mouth 2 (two) times daily.   Yes Historical Provider, MD  donepezil (ARICEPT) 10 MG tablet Take 10 mg by mouth at bedtime.   Yes Historical Provider, MD  LAMICTAL XR 100 MG TB24 Take 2 tablets (200 mg total) by mouth at bedtime. 06/20/15  Yes Nilda RiggsNancy Carolyn Martin, NP  levothyroxine (SYNTHROID, LEVOTHROID) 50 MCG tablet Take 50 mcg by mouth daily.   Yes Historical Provider, MD  loratadine (CLARITIN) 10 MG tablet Take  10 mg by mouth at bedtime.  10/18/14 11/07/15 Yes Historical Provider, MD  risperiDONE (RISPERDAL) 2 MG tablet Take 2 mg by mouth at bedtime.    Yes Historical Provider, MD  sertraline (ZOLOFT) 100 MG tablet Take 100 mg by mouth at bedtime.    Yes Historical Provider, MD  traZODone (DESYREL) 50 MG tablet Take 50 mg by mouth at bedtime as needed for sleep.    Yes Historical Provider, MD    Physical Exam: Filed Vitals:   11/07/15 1430 11/07/15 1500 11/07/15 1530 11/07/15 1600  BP: 98/55 103/70 89/53 88/60   Pulse: 79 81 96 84  Temp:      TempSrc:      Resp: 28 23 24 28   SpO2: 100% 97% 96% 98%      Constitutional: NAD, calm, comfortable Filed Vitals:   11/07/15 1430 11/07/15 1500 11/07/15 1530 11/07/15 1600  BP: 98/55 103/70 89/53 88/60   Pulse: 79 81 96 84  Temp:      TempSrc:      Resp: 28 23 24 28   SpO2: 100% 97% 96% 98%   Eyes: PERRL, lids and conjunctivae normal ENMT: Mucous membranes are Dry. Posterior pharynx clear of any exudate or lesions.poor dental hygiene Neck: normal, supple, no masses, no thyromegaly Respiratory: rhales diffusely, no wheezes, equal chest rise, poor inspiratory effort Cardiovascular: Regular rate and rhythm, no murmurs / rubs / gallops. No extremity edema. 2+ pedal pulses. No carotid bruits.  Abdomen: no tenderness, no masses palpated. No hepatosplenomegaly. Bowel sounds positive.  Musculoskeletal: no clubbing / cyanosis. No joint deformity upper and lower extremities. Good ROM, no contractures. Normal muscle tone.  Skin: no rashes, lesions, ulcers. No induration on limited exam Neurologic: Limited cooperation with examiner secondary to MR, answers simple questions moves extremities equally Psychiatric: Unable to accurately assess secondary to MR  Labs on Admission: I have personally reviewed following labs and imaging studies  CBC:  Recent Labs Lab 11/07/15 1400  WBC 6.1  NEUTROABS 5.3  HGB 13.9  HCT 41.0  MCV 97.9  PLT 123*   Basic  Metabolic Panel:  Recent Labs Lab 11/07/15 1400  NA 144  K 4.7  CL 109  CO2 26  GLUCOSE 148*  BUN 31*  CREATININE 1.55*  CALCIUM 8.8*   GFR: CrCl cannot be calculated (Unknown ideal weight.). Liver Function Tests:  Recent Labs Lab 11/07/15 1400  AST 31  ALT 19  ALKPHOS 54  BILITOT 1.2  PROT 7.8  ALBUMIN 4.1   No results for input(s): LIPASE, AMYLASE in the last 168 hours. No results for input(s): AMMONIA in the last 168 hours. Coagulation Profile: No results for input(s): INR, PROTIME in the last 168 hours. Cardiac Enzymes: No results for input(s): CKTOTAL, CKMB, CKMBINDEX, TROPONINI in the last 168 hours. BNP (last 3 results) No results for input(s): PROBNP in the last 8760 hours. HbA1C: No results for input(s): HGBA1C in the last 72 hours. CBG: No results for input(s): GLUCAP in the last 168 hours. Lipid Profile: No results for input(s): CHOL, HDL, LDLCALC, TRIG, CHOLHDL, LDLDIRECT in the last 72 hours. Thyroid Function Tests: No results for input(s): TSH, T4TOTAL, FREET4, T3FREE, THYROIDAB in the last 72 hours. Anemia Panel: No results for input(s): VITAMINB12, FOLATE, FERRITIN, TIBC, IRON, RETICCTPCT in the last 72 hours. Urine analysis: No results found for: COLORURINE, APPEARANCEUR, LABSPEC, PHURINE, GLUCOSEU, HGBUR, BILIRUBINUR, KETONESUR, PROTEINUR, UROBILINOGEN, NITRITE, LEUKOCYTESUR Sepsis Labs: !!!!!!!!!!!!!!!!!!!!!!!!!!!!!!!!!!!!!!!!!!!! @LABRCNTIP (procalcitonin:4,lacticidven:4) )No results found for this or any previous visit (from the past 240 hour(s)).   Radiological Exams on Admission: Dg Chest 2 View  11/07/2015  CLINICAL DATA:  Aspirated a hot dog last night, dislodged by Heimlich maneuver, shortness of breath since incident, history Down syndrome and aspiration pneumonia EXAM: CHEST  2 VIEW COMPARISON:  04/01/2015 FINDINGS: Borderline enlargement of cardiac silhouette. Mediastinal contours and pulmonary vascularity normal. Bibasilar infiltrates  which may represent aspiration or pneumonia. Small amount of infiltrate or atelectasis in RIGHT upper lobe above minor fissure. Upper lungs otherwise clear. No pleural effusion or pneumothorax. Bones unremarkable. IMPRESSION: Bibasilar infiltrates which could represent pneumonia or aspiration. Electronically Signed   By: Ulyses SouthwardMark  Boles M.D.   On: 11/07/2015 13:50    EKG: Independently reviewed. Sinus rhythm with no ST elevations or depressions  Assessment/Plan Active Problems:   Aspiration pneumonia (HCC) - Patient presenting with difficulty breathing after recent aspiration event (reportedly aspirated on hot dog requiring Heimlich maneuver) - For now will admit placed in step down and cover with Rocephin and azithromycin. - Recommended evaluation by PCCM given current oxygen requirements.   Seizure - Continue home medication regimen  Depression - Continue home antidepressant  Anxiety - Patient is on Xanax. We'll continue home medication regimen as I am trying to avoid withdrawal   DVT prophylaxis: Heparin Code Status: presumed full Family Communication: none Disposition Plan: Stepdown Consults called: requested PCCM Admission status: inpatient   Penny PiaVEGA, Easten Maceachern MD [facility] Pager [phone]  If 7PM-7AM, please contact night-coverage [email or web address] Password [email or web address]  11/07/2015, 6:11 PM

## 2015-11-07 NOTE — ED Notes (Signed)
Bed: RESA Expected date:  Expected time:  Means of arrival:  Comments: EMS 8341 F - aspiration, non-re-breather

## 2015-11-07 NOTE — ED Notes (Signed)
[facility] Provider: Marcelino DusterMichelle: [phone]  States she should return around 20:00 tonight

## 2015-11-07 NOTE — ED Notes (Signed)
Blood draw delayed,  Intensivist said hold off on lactic.

## 2015-11-07 NOTE — ED Provider Notes (Signed)
CSN: [id number]     Arrival date & time 11/07/15  1301 History   First MD Initiated Contact with Patient 11/07/15 1326     Chief Complaint  Patient presents with  . Aspiration  . Shortness of Breath     (Consider location/radiation/quality/duration/timing/severity/associated sxs/prior Treatment) Patient is a 40 y.o. female presenting with shortness of breath. The history is provided by a caregiver. The history is limited by the condition of the patient (Down syndrome with mental retardation).  Shortness of Breath She apparently choked on a hot dog yesterday requiring Heimlich maneuver to get the hot dog. She has history of aspiration in the past. Today, she was having difficulty breathing was taken to urgent care where oxygen saturation was 65%. She has not been coughing there has not been any fever. Apparently, oxygen saturation IS 77% with oxygen and required nonrebreather mask to get oxygen levels adequate.  Past Medical History  Diagnosis Date  . Thyroid disease   . Down's syndrome   . Seizures (HCC)   . Depression   . Anxiety   . Headache    Past Surgical History  Procedure Laterality Date  . No past surgery     History reviewed. No pertinent family history. Social History  Substance Use Topics  . Smoking status: Never Smoker   . Smokeless tobacco: None  . Alcohol Use: No   OB History    No data available     Review of Systems  Unable to perform ROS: Patient nonverbal  Respiratory: Positive for shortness of breath.       Allergies  Review of patient's allergies indicates no known allergies.  Home Medications   Prior to Admission medications   Medication Sig Start Date End Date Taking? Authorizing Provider  ALPRAZolam Prudy Feeler(XANAX) 0.5 MG tablet Take 0.5 mg by mouth 2 (two) times daily.    Historical Provider, MD  donepezil (ARICEPT) 10 MG tablet Take 10 mg by mouth at bedtime.    Historical Provider, MD  LAMICTAL XR 100 MG TB24 Take 2 tablets (200 mg total) by  mouth at bedtime. 06/20/15   Nilda RiggsNancy Carolyn Martin, NP  levothyroxine (SYNTHROID, LEVOTHROID) 50 MCG tablet Take 50 mcg by mouth daily.    Historical Provider, MD  loratadine (CLARITIN) 10 MG tablet Take 10 mg by mouth at bedtime.  10/18/14 10/18/15  Historical Provider, MD  polyethylene glycol powder (GLYCOLAX/MIRALAX) powder Take 17 g by mouth daily as needed (constipation).  05/22/14   Historical Provider, MD  risperiDONE (RISPERDAL) 2 MG tablet Take 2 mg by mouth at bedtime.     Historical Provider, MD  sertraline (ZOLOFT) 100 MG tablet Take 100 mg by mouth at bedtime.     Historical Provider, MD  traZODone (DESYREL) 50 MG tablet Take 50 mg by mouth at bedtime as needed for sleep.     Historical Provider, MD   Temp(Src) 97.8 F (36.6 C) (Oral)  SpO2 93% Physical Exam  Nursing note and vitals reviewed.  Frail-appearing [age] year old female, resting comfortably and in no acute distress. Vital signs are normal. Oxygen saturation is 93%, which is normal, but achieved while on 100% oxygen via a nonrebreather mask. Head is normocephalic and atraumatic. PERRLA, EOMI. Oropharynx is clear. Neck is nontender and supple without adenopathy or JVD. Back is nontender and there is no CVA tenderness. Lungs have coarse rhonchi throughout. Chest is nontender. Heart has regular rate and rhythm without murmur. Abdomen is soft, flat, nontender without masses or hepatosplenomegaly and peristalsis is  normoactive. Extremities have no cyanosis or edema, full range of motion is present. Skin is warm and dry without rash. Neurologic: She is awake and alert but nonverbal and does not follow commands, cranial nerves are intact, there are no motor or sensory deficits.  ED Course  Procedures (including critical care time) Labs Review Results for orders placed or performed during the hospital encounter of 11/07/15  Blood gas, venous  Result Value Ref Range   FIO2 1.00    Delivery systems OXYGEN MASK    pH, Ven 7.290  7.250 - 7.300   pCO2, Ven 56.1 (H) 45.0 - 50.0 mmHg   pO2, Ven BELOW REPORTABLE RANGE 31.0 - 45.0 mmHg   Bicarbonate 26.1 (H) 20.0 - 24.0 mEq/L   TCO2 24.0 0 - 100 mmol/L   Acid-base deficit 1.1 0.0 - 2.0 mmol/L   O2 Saturation 40.8 %   Patient temperature 98.6    Collection site VEIN    Drawn by COLLECTED BY LABORATORY    Sample type VEIN   Comprehensive metabolic panel  Result Value Ref Range   Sodium 144 135 - 145 mmol/L   Potassium 4.7 3.5 - 5.1 mmol/L   Chloride 109 101 - 111 mmol/L   CO2 26 22 - 32 mmol/L   Glucose, Bld 148 (H) 65 - 99 mg/dL   BUN 31 (H) 6 - 20 mg/dL   Creatinine, Ser 1.611.55 (H) 0.44 - 1.00 mg/dL   Calcium 8.8 (L) 8.9 - 10.3 mg/dL   Total Protein 7.8 6.5 - 8.1 g/dL   Albumin 4.1 3.5 - 5.0 g/dL   AST 31 15 - 41 U/L   ALT 19 14 - 54 U/L   Alkaline Phosphatase 54 38 - 126 U/L   Total Bilirubin 1.2 0.3 - 1.2 mg/dL   GFR calc non Af Amer 41 (L) >60 mL/min   GFR calc Af Amer 47 (L) >60 mL/min   Anion gap 9 5 - 15  CBC with Differential  Result Value Ref Range   WBC 6.1 4.0 - 10.5 K/uL   RBC 4.19 3.87 - 5.11 MIL/uL   Hemoglobin 13.9 12.0 - 15.0 g/dL   HCT 09.641.0 04.536.0 - 40.946.0 %   MCV 97.9 78.0 - 100.0 fL   MCH 33.2 26.0 - 34.0 pg   MCHC 33.9 30.0 - 36.0 g/dL   RDW 81.113.7 91.411.5 - 78.215.5 %   Platelets 123 (L) 150 - 400 K/uL   Neutrophils Relative % 87 %   Neutro Abs 5.3 1.7 - 7.7 K/uL   Lymphocytes Relative 8 %   Lymphs Abs 0.5 (L) 0.7 - 4.0 K/uL   Monocytes Relative 5 %   Monocytes Absolute 0.3 0.1 - 1.0 K/uL   Eosinophils Relative 0 %   Eosinophils Absolute 0.0 0.0 - 0.7 K/uL   Basophils Relative 0 %   Basophils Absolute 0.0 0.0 - 0.1 K/uL  I-Stat CG4 Lactic Acid, ED  Result Value Ref Range   Lactic Acid, Venous 3.75 (HH) 0.5 - 2.0 mmol/L   Comment NOTIFIED PHYSICIAN   I-Stat beta hCG blood, ED  Result Value Ref Range   I-stat hCG, quantitative <5.0 <5 mIU/mL   Comment 3            Imaging Review Dg Chest 2 View  11/07/2015  CLINICAL DATA:   Aspirated a hot dog last night, dislodged by Heimlich maneuver, shortness of breath since incident, history Down syndrome and aspiration pneumonia EXAM: CHEST  2 VIEW COMPARISON:  04/01/2015 FINDINGS: Borderline  enlargement of cardiac silhouette. Mediastinal contours and pulmonary vascularity normal. Bibasilar infiltrates which may represent aspiration or pneumonia. Small amount of infiltrate or atelectasis in RIGHT upper lobe above minor fissure. Upper lungs otherwise clear. No pleural effusion or pneumothorax. Bones unremarkable. IMPRESSION: Bibasilar infiltrates which could represent pneumonia or aspiration. Electronically Signed   By: Ulyses Southward M.D.   On: 11/07/2015 13:50   I have personally reviewed and evaluated these images and lab results as part of my medical decision-making.   EKG Interpretation   Date/Time:  Thursday November 07 2015 13:11:48 EDT Ventricular Rate:  79 PR Interval:    QRS Duration: 96 QT Interval:  358 QTC Calculation: 411 R Axis:   42 Text Interpretation:  Sinus rhythm Left atrial enlargement ST elev,  probable normal early repol pattern Baseline wander When compared with ECG  of 09/18/2012, No significant change was found Confirmed by [facility]  MD, Loukas Antonson  ([id number]) on 11/07/2015 1:29:58 PM      CRITICAL CARE Performed by: UEAVW,[email or web address] Total critical care time: 45 minutes Critical care time was exclusive of separately billable procedures and treating other patients. Critical care was necessary to treat or prevent imminent or life-threatening deterioration. Critical care was time spent personally by me on the following activities: development of treatment plan with patient and/or surrogate as well as nursing, discussions with consultants, evaluation of patient's response to treatment, examination of patient, obtaining history from patient or surrogate, ordering and performing treatments and interventions, ordering and review of laboratory studies, ordering and review of  radiographic studies, pulse oximetry and re-evaluation of patient's condition.  MDM   Final diagnoses:  Community acquired pneumonia  Elevated lactic acid level  Acute kidney injury (nontraumatic) (HCC)    Episode of aspiration with x-ray today showing pneumonia, presumably aspiration. Old records are reviewed and she does have a prior hospitalization for aspiration pneumonia. She does not appear to be septic as she is afebrile and without tachycardia or hypotension. She started on antibiotics after blood cultures are obtained.  Lactic acid level is elevated and she she is given early goal-directed fluid therapy. Repeat lactic acid is pending. Oxygen saturation is remaining stable so long as she is on a nonrebreather mask. Case is discussed with Dr. Cena Benton of [facility] who agrees to admit the patient.  Dione Booze, MD 11/07/15 [phone]

## 2015-11-07 NOTE — ED Notes (Signed)
Per urgent care, patient w/ hx of Downs syndrome aspirated on hot dog last night. Heimlich maneuver performed and hot dog came back up. Today pt woke up in resp distress. At urgent care O2 sat on RA was 65%, up to 77% on 3 L. Respiratory rate was 48/min. Coming EMS, on non-rebreather with saturations increased to 90%, rhonchi auscultated throughout lung fields

## 2015-11-07 NOTE — Progress Notes (Signed)
PHARMACY NOTE -  ANTIBIOTIC RENAL DOSE ADJUSTMENT   Request received for Pharmacy to assist with antibiotic renal dose adjustment.  Patient has been initiated on Rocephin 1g IV q24h and Azithromycin 500mg  IV q24h for sepsis and aspiration pneumonia. SCr 1.55, estimated CrCl 40 ml/min  Plan:  Current dosage is appropriate and need for further dosage adjustment appears unlikely at present.  Will sign off at this time.  Please reconsult if a change in clinical status warrants re-evaluation of dosage.  Loralee PacasErin Issiac Jamar, PharmD, BCPS Pager: [phone] 11/07/2015 9:17 PM

## 2015-11-07 NOTE — ED Notes (Signed)
Unable to obtain second culture, admin antibiotic due to possible sepsis. First set of culture obtained and sent to lab

## 2015-11-07 NOTE — Consult Note (Signed)
Name: Ann Yang A Cadle MRN: [id number] DOB: 01/29/1976    ADMISSION DATE:  11/07/2015 CONSULTATION DATE:  11/07/2015  REFERRING MD :  Cena BentonVega, [facility]  CHIEF COMPLAINT:  Respiratory distress  HISTORY OF PRESENT ILLNESS:  40 year old woman with Down syndrome /mental retardation and seizure disorder anxiety and depression, resident of assisted living facility who was brought into the emergency room for respiratory distress and noted to be hypoxic with chest x-ray showing bibasal pneumonia. We are consulted to assist with management. Patient is not cooperative with history or physical exam and I'm not sure if her baseline, history is obtained after reviewing her medical record. 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  has past surgical history that includes no past surgery. Prior to Admission medications   Medication Sig Start Date End Date Taking? Authorizing Provider  ALPRAZolam Prudy Feeler(XANAX) 0.5 MG tablet Take 0.5 mg by mouth 2 (two) times daily.   Yes Historical Provider, MD  donepezil (ARICEPT) 10 MG tablet Take 10 mg by mouth at bedtime.   Yes Historical Provider, MD  LAMICTAL XR 100 MG TB24 Take 2 tablets (200 mg total) by mouth at bedtime. 06/20/15  Yes Nilda RiggsNancy Carolyn Martin, NP  levothyroxine (SYNTHROID, LEVOTHROID) 50 MCG tablet Take 50 mcg by mouth daily.   Yes Historical  Provider, MD  loratadine (CLARITIN) 10 MG tablet Take 10 mg by mouth at bedtime.  10/18/14 11/07/15 Yes Historical Provider, MD  risperiDONE (RISPERDAL) 2 MG tablet Take 2 mg by mouth at bedtime.    Yes Historical Provider, MD  sertraline (ZOLOFT) 100 MG tablet Take 100 mg by mouth at bedtime.    Yes Historical Provider, MD  traZODone (DESYREL) 50 MG tablet Take 50 mg by mouth at bedtime as needed for sleep.    Yes Historical Provider, MD   No Known Allergies  FAMILY HISTORY:  family history is not on file. SOCIAL HISTORY:  reports that she has never smoked. She does not have any smokeless tobacco history on file. She reports that she does not drink alcohol or use illicit drugs.  REVIEW OF SYSTEMS:   Unable to obtain since patient is not cooperative  SUBJECTIVE:   VITAL SIGNS: Temp:  [97.8 F (36.6 C)-101.2 F (38.4 C)] 101.2 F (38.4 C) (06/22 1908) Pulse Rate:  [74-96] 81 (06/22 1908) Resp:  [21-39] 27 (06/22 1908) BP: (88-103)/(50-70) 98/50 mmHg (06/22 1908) SpO2:  [65 %-100 %] 95 % (06/22 1908)  PHYSICAL EXAMINATION: General:  Acutely ill, on nonrebreather Neuro:  Answer simple questions and follows one-step commands, grossly nonfocal HEENT:  No JVD, pupils murmurs reactive to light Cardiovascular:  S1-S2 normal Lungs:  Would not allow me to auscultate lungs, diffuse rales noted by prior M.D. exam Abdomen:  Soft and nontender Musculoskeletal:  No deformities, good pulses Skin:  Good cap refill   Recent Labs Lab 11/07/15 1400  NA 144  K 4.7  CL 109  CO2 26  BUN 31*  CREATININE 1.55*  GLUCOSE 148*    Recent Labs Lab 11/07/15 1400  HGB 13.9  HCT 41.0  WBC 6.1  PLT 123*   Dg Chest 2 View  11/07/2015  CLINICAL DATA:  Aspirated a hot dog last night, dislodged by Heimlich maneuver, shortness of breath since incident, history Down syndrome and aspiration pneumonia EXAM: CHEST  2 VIEW COMPARISON:  04/01/2015 FINDINGS: Borderline enlargement of cardiac silhouette.  Mediastinal contours and pulmonary vascularity normal. Bibasilar infiltrates which may represent aspiration or pneumonia. Small amount of infiltrate or atelectasis in RIGHT upper lobe above minor fissure. Upper lungs otherwise clear. No pleural effusion or pneumothorax. Bones unremarkable. IMPRESSION: Bibasilar infiltrates which could represent pneumonia or aspiration. Electronically Signed   By: Ulyses SouthwardMark  Boles M.D.   On: 11/07/2015 13:50    ASSESSMENT / PLAN:  Acute hypoxic respiratory failure Aspiration pneumonia -Continue nonrebreather -If worsening hypoxia, doubt that she will keep BiPAP and will likely need intubation for mechanical ventilation -Continue consider bronchoscopy only if non-resolving pneumonia or if she gets intubated -Agree with ceftriaxone and azithromycin  Severe sepsis -Received fluid bolus, would not allow repeat lactate blood draw-she refused and was not cooperative  Down syndrome/mental retardation/seizure disorder -use Haldol when necessary while nothing by mouth -She is on Xanax and can use low-dose benzos for severe agitation -May even require Precedex  PCCM will follow   Cyril Mourningakesh Alva MD. FCCP. [facility] Pager [phone] If no response call [phone]   11/07/2015    11/07/2015, 8:01 PM

## 2015-11-08 ENCOUNTER — Inpatient Hospital Stay (HOSPITAL_COMMUNITY): Payer: Medicare Other

## 2015-11-08 DIAGNOSIS — J9690 Respiratory failure, unspecified, unspecified whether with hypoxia or hypercapnia: Secondary | ICD-10-CM | POA: Insufficient documentation

## 2015-11-08 DIAGNOSIS — I959 Hypotension, unspecified: Secondary | ICD-10-CM | POA: Insufficient documentation

## 2015-11-08 DIAGNOSIS — E440 Moderate protein-calorie malnutrition: Secondary | ICD-10-CM | POA: Insufficient documentation

## 2015-11-08 LAB — PHOSPHORUS
Phosphorus: 2 mg/dL — ABNORMAL LOW (ref 2.5–4.6)
Phosphorus: 1.7 mg/dL — ABNORMAL LOW (ref 2.5–4.6)

## 2015-11-08 LAB — URINE MICROSCOPIC-ADD ON

## 2015-11-08 LAB — BASIC METABOLIC PANEL
Anion gap: 7 (ref 5–15)
BUN: 18 mg/dL (ref 6–20)
CALCIUM: 8 mg/dL — AB (ref 8.9–10.3)
CHLORIDE: 107 mmol/L (ref 101–111)
CO2: 25 mmol/L (ref 22–32)
CREATININE: 1.07 mg/dL — AB (ref 0.44–1.00)
GFR calc non Af Amer: >60 mL/min (ref >60)
Glucose, Bld: 191 mg/dL — ABNORMAL HIGH (ref 65–99)
Potassium: 4.1 mmol/L (ref 3.5–5.1)
SODIUM: 139 mmol/L (ref 135–145)

## 2015-11-08 LAB — BLOOD GAS, ARTERIAL
Acid-base deficit: 1.2 mmol/L (ref 0.0–2.0)
BICARBONATE: 24.4 meq/L — AB (ref 20.0–24.0)
Drawn by: 257701
FIO2: 1
LHR: 18 {breaths}/min
MECHVT: 350 mL
O2 Saturation: 95.9 %
PATIENT TEMPERATURE: 98.6
PEEP/CPAP: 5 cmH2O
TCO2: 22.7 mmol/L (ref 0–100)
pCO2 arterial: 47.9 mmHg — ABNORMAL HIGH (ref 35.0–45.0)
pH, Arterial: 7.328 — ABNORMAL LOW (ref 7.350–7.450)
pO2, Arterial: 86.1 mmHg (ref 80.0–100.0)

## 2015-11-08 LAB — URINALYSIS, ROUTINE W REFLEX MICROSCOPIC
Bilirubin Urine: NEGATIVE
Glucose, UA: NEGATIVE mg/dL
Ketones, ur: NEGATIVE mg/dL
Leukocytes, UA: NEGATIVE
NITRITE: NEGATIVE
PH: 6 (ref 5.0–8.0)
PROTEIN: 30 mg/dL — AB
Specific Gravity, Urine: 1.016 (ref 1.005–1.030)

## 2015-11-08 LAB — CBC
HCT: 35.8 % — ABNORMAL LOW (ref 36.0–46.0)
Hemoglobin: 12.4 g/dL (ref 12.0–15.0)
MCH: 33.8 pg (ref 26.0–34.0)
MCHC: 34.6 g/dL (ref 30.0–36.0)
MCV: 97.5 fL (ref 78.0–100.0)
PLATELETS: 88 10*3/uL — AB (ref 150–400)
RBC: 3.67 MIL/uL — ABNORMAL LOW (ref 3.87–5.11)
RDW: 13.6 % (ref 11.5–15.5)
WBC: 4 10*3/uL (ref 4.0–10.5)

## 2015-11-08 LAB — GLUCOSE, CAPILLARY: GLUCOSE-CAPILLARY: 166 mg/dL — AB (ref 65–99)

## 2015-11-08 LAB — TRIGLYCERIDES: TRIGLYCERIDES: 61 mg/dL (ref <150)

## 2015-11-08 LAB — MAGNESIUM
MAGNESIUM: 1.6 mg/dL — AB (ref 1.7–2.4)
Magnesium: 1.6 mg/dL — ABNORMAL LOW (ref 1.7–2.4)

## 2015-11-08 MED ORDER — MAGNESIUM SULFATE 2 GM/50ML IV SOLN
2.0000 g | Freq: Once | INTRAVENOUS | Status: AC
  Administered 2015-11-08: 2 g via INTRAVENOUS
  Filled 2015-11-08: qty 50

## 2015-11-08 MED ORDER — PHENYLEPHRINE HCL 10 MG/ML IJ SOLN
30.0000 ug/min | INTRAMUSCULAR | Status: DC
  Administered 2015-11-08: 10 ug/min via INTRAVENOUS
  Filled 2015-11-08 (×2): qty 1

## 2015-11-08 MED ORDER — DONEPEZIL HCL 10 MG PO TABS
10.0000 mg | ORAL_TABLET | Freq: Every day | ORAL | Status: DC
  Administered 2015-11-08 – 2015-11-14 (×7): 10 mg
  Filled 2015-11-08 (×7): qty 1

## 2015-11-08 MED ORDER — TRAZODONE HCL 50 MG PO TABS
50.0000 mg | ORAL_TABLET | Freq: Every evening | ORAL | Status: DC | PRN
Start: 2015-11-08 — End: 2015-11-16

## 2015-11-08 MED ORDER — ANTISEPTIC ORAL RINSE SOLUTION (CORINZ)
7.0000 mL | Freq: Four times a day (QID) | OROMUCOSAL | Status: DC
  Administered 2015-11-08 – 2015-11-15 (×29): 7 mL via OROMUCOSAL

## 2015-11-08 MED ORDER — SODIUM CHLORIDE 0.9 % IV SOLN
25.0000 ug/h | INTRAVENOUS | Status: DC
  Administered 2015-11-08: 50 ug/h via INTRAVENOUS
  Administered 2015-11-09: 100 ug/h via INTRAVENOUS
  Administered 2015-11-12 – 2015-11-14 (×2): 25 ug/h via INTRAVENOUS
  Filled 2015-11-08 (×5): qty 50

## 2015-11-08 MED ORDER — FREE WATER
30.0000 mL | Status: DC
  Administered 2015-11-08 – 2015-11-09 (×6): 30 mL

## 2015-11-08 MED ORDER — VITAL HIGH PROTEIN PO LIQD
1000.0000 mL | ORAL | Status: DC
  Filled 2015-11-08: qty 1000

## 2015-11-08 MED ORDER — LAMOTRIGINE 100 MG PO TABS
100.0000 mg | ORAL_TABLET | Freq: Two times a day (BID) | ORAL | Status: DC
  Administered 2015-11-08 – 2015-11-15 (×14): 100 mg
  Filled 2015-11-08 (×16): qty 1

## 2015-11-08 MED ORDER — CHLORHEXIDINE GLUCONATE 0.12% ORAL RINSE (MEDLINE KIT)
15.0000 mL | Freq: Two times a day (BID) | OROMUCOSAL | Status: DC
  Administered 2015-11-08 – 2015-11-15 (×16): 15 mL via OROMUCOSAL

## 2015-11-08 MED ORDER — FENTANYL CITRATE (PF) 100 MCG/2ML IJ SOLN
INTRAMUSCULAR | Status: AC
  Filled 2015-11-08: qty 4

## 2015-11-08 MED ORDER — PANTOPRAZOLE SODIUM 40 MG PO PACK
40.0000 mg | PACK | ORAL | Status: DC
  Administered 2015-11-08 – 2015-11-14 (×7): 40 mg
  Filled 2015-11-08 (×7): qty 20

## 2015-11-08 MED ORDER — PRO-STAT SUGAR FREE PO LIQD: 30.0000 mL | Freq: Two times a day (BID) | ORAL | Status: DC

## 2015-11-08 MED ORDER — MIDAZOLAM HCL 2 MG/2ML IJ SOLN
2.0000 mg | Freq: Once | INTRAMUSCULAR | Status: AC
  Administered 2015-11-08: 2 mg via INTRAVENOUS

## 2015-11-08 MED ORDER — CHLORHEXIDINE GLUCONATE 0.12 % MT SOLN
OROMUCOSAL | Status: AC
  Administered 2015-11-08: 15 mL via OROMUCOSAL
  Filled 2015-11-08: qty 15

## 2015-11-08 MED ORDER — ETOMIDATE 2 MG/ML IV SOLN
0.3000 mg/kg | Freq: Once | INTRAVENOUS | Status: AC
  Administered 2015-11-08: 20 mg via INTRAVENOUS

## 2015-11-08 MED ORDER — LEVOTHYROXINE SODIUM 50 MCG PO TABS
50.0000 ug | ORAL_TABLET | Freq: Every day | ORAL | Status: DC
  Administered 2015-11-09 – 2015-11-15 (×7): 50 ug
  Filled 2015-11-08 (×7): qty 1

## 2015-11-08 MED ORDER — FENTANYL BOLUS VIA INFUSION
50.0000 ug | INTRAVENOUS | Status: DC | PRN
  Administered 2015-11-08 – 2015-11-14 (×13): 50 ug via INTRAVENOUS
  Filled 2015-11-08: qty 50

## 2015-11-08 MED ORDER — PIPERACILLIN-TAZOBACTAM 3.375 G IVPB
3.3750 g | Freq: Three times a day (TID) | INTRAVENOUS | Status: DC
  Administered 2015-11-08 – 2015-11-15 (×22): 3.375 g via INTRAVENOUS
  Filled 2015-11-08 (×22): qty 50

## 2015-11-08 MED ORDER — SERTRALINE HCL 100 MG PO TABS
100.0000 mg | ORAL_TABLET | Freq: Every day | ORAL | Status: DC
  Administered 2015-11-08 – 2015-11-14 (×7): 100 mg
  Filled 2015-11-08 (×7): qty 1

## 2015-11-08 MED ORDER — MIDAZOLAM HCL 2 MG/2ML IJ SOLN
2.0000 mg | INTRAMUSCULAR | Status: DC | PRN
  Administered 2015-11-08 – 2015-11-15 (×20): 2 mg via INTRAVENOUS
  Filled 2015-11-08 (×25): qty 2

## 2015-11-08 MED ORDER — RISPERIDONE 2 MG PO TABS
2.0000 mg | ORAL_TABLET | Freq: Every day | ORAL | Status: DC
  Administered 2015-11-08 – 2015-11-14 (×7): 2 mg
  Filled 2015-11-08 (×8): qty 1

## 2015-11-08 MED ORDER — ONDANSETRON HCL 4 MG/2ML IJ SOLN: 4.0000 mg | Freq: Four times a day (QID) | INTRAMUSCULAR | Status: DC | PRN

## 2015-11-08 MED ORDER — PROPOFOL 1000 MG/100ML IV EMUL
0.0000 ug/kg/min | INTRAVENOUS | Status: DC
  Administered 2015-11-08: 5 ug/kg/min via INTRAVENOUS
  Administered 2015-11-09: 20 ug/kg/min via INTRAVENOUS
  Administered 2015-11-09 – 2015-11-11 (×3): 10 ug/kg/min via INTRAVENOUS
  Administered 2015-11-12: 5 ug/kg/min via INTRAVENOUS
  Administered 2015-11-12: 10 ug/kg/min via INTRAVENOUS
  Administered 2015-11-14: 8 ug/kg/min via INTRAVENOUS
  Administered 2015-11-14: 15 ug/kg/min via INTRAVENOUS
  Administered 2015-11-15: 10 ug/kg/min via INTRAVENOUS
  Administered 2015-11-15: 20 ug/kg/min via INTRAVENOUS
  Filled 2015-11-08 (×13): qty 100

## 2015-11-08 MED ORDER — JEVITY 1.2 CAL PO LIQD
1000.0000 mL | ORAL | Status: DC
  Administered 2015-11-08 – 2015-11-13 (×3): 1000 mL

## 2015-11-08 MED ORDER — MIDAZOLAM HCL 2 MG/2ML IJ SOLN
INTRAMUSCULAR | Status: AC
  Filled 2015-11-08: qty 4

## 2015-11-08 MED ORDER — PHENYLEPHRINE HCL 10 MG/ML IJ SOLN
30.0000 ug/min | INTRAVENOUS | Status: DC
  Administered 2015-11-08: 160 ug/min via INTRAVENOUS
  Administered 2015-11-09: 170 ug/min via INTRAVENOUS
  Administered 2015-11-09: 150 ug/min via INTRAVENOUS
  Administered 2015-11-09: 200 ug/min via INTRAVENOUS
  Filled 2015-11-08 (×4): qty 4

## 2015-11-08 MED ORDER — FENTANYL CITRATE (PF) 100 MCG/2ML IJ SOLN
50.0000 ug | Freq: Once | INTRAMUSCULAR | Status: AC
  Administered 2015-11-08: 50 ug via INTRAVENOUS

## 2015-11-08 NOTE — Progress Notes (Signed)
Pharmacy Antibiotic Follow-up Note  Ann Yang is a 40 y.o. year-old female admitted on 11/07/2015.  The patient is currently on day 1 of Zosyn for aspiration PNA. 40 yoF with Down's syndrome, had aspiration event at group home requiring Heimlich maneuver.  To ED: hypoxic, rule out sepsis.   Assessment/Plan: Zosyn 3.375g IV q8h (4 hour infusion).  Temp (24hrs), Avg:101.2 F (38.4 C), Min:101.2 F (38.4 C), Max:101.2 F (38.4 C)   Recent Labs Lab 11/07/15 1400 11/08/15 0940  WBC 6.1 4.0    Recent Labs Lab 11/07/15 1400 11/08/15 0940  CREATININE 1.55* 1.07*   Estimated Creatinine Clearance: 45.7 mL/min (by C-G formula based on Cr of 1.07).    No Known Allergies  Antimicrobials this admission: 6/23 Zosyn  >>   Dose adjustments this admission:  Microbiology results: 6/22 BCx: sent  6/22 MRSA PCR: neg         Sputum ordered:   Thank you for allowing pharmacy to be a part of this patient's care.  Otho BellowsGreen, Torion Hulgan L PharmD 11/08/2015 1:30 PM

## 2015-11-08 NOTE — Progress Notes (Signed)
SLP Cancellation Note  Patient Details Name: Ann Yang MRN: [id number] DOB: [birth year]   Cancelled treatment:       Reason Eval/Treat Not Completed: Medical issues which prohibited therapy. Per RN pt being intubated. Will follow for updates and readiness via chart.    Yuvonne Lanahan, Riley NearingBonnie Caroline 11/08/2015, 11:43 AM

## 2015-11-08 NOTE — Progress Notes (Signed)
eLink Physician-Brief Progress Note Patient Name: Ann Yang DOB: May 30, 1975 MRN: [id number]   Date of Service  11/08/2015  HPI/Events of Note  Request for bilateral wrist restraints.  eICU Interventions  Will order bilateral soft wrist restraints.      Intervention Category Minor Interventions: Agitation / anxiety - evaluation and management  [name],Tildon Silveria Ann 11/08/2015, 8:39 PM

## 2015-11-08 NOTE — Progress Notes (Signed)
eLink Physician-Brief Progress Note Patient Name: Ann Yang DOB: [birth year] MRN: [id number]   Date of Service  11/08/2015  HPI/Events of Note  Bedside nurse contacted regarding persistent shock. Questioned need for IV fluid support. Currently on fentanyl and propofol infusions. Also on Neo-Synephrine infusion for blood pressure support. Patchy bilateral opacities. Requiring FiO2 1.0 to maintain saturation 96%. Notably patient has serum magnesium 1.6.   eICU Interventions  1. Hold on further IV fluid bolus 2. Continue vasopressor support for goal mean arterial pressure greater than 65 3. Magnesium sulfate 2 g IV now      Intervention Category Major Interventions: Shock - evaluation and management;Respiratory failure - evaluation and management  Lawanda CousinsJennings Mirta Mally 11/08/2015, 10:26 PM

## 2015-11-08 NOTE — Progress Notes (Signed)
She was not able to keep supplemental oxygen on, and became progressively more agitated.  Worsening hypoxia.  D/w pt's caregiver.  Required intubation.  Will change service to PCCM.  Coralyn HellingVineet Zigmond Trela, MD [facility] 11/08/2015, 12:09 PM Pager:  [phone] After 3pm call: [phone]

## 2015-11-08 NOTE — Progress Notes (Signed)
eLink Physician-Brief Progress Note Patient Name: Ann Yang DOB: [birth year] MRN: [id number]   Date of Service  11/08/2015  HPI/Events of Note  Asked to review CXR to assess L IJ CVL placement. L IJ CVL tip in distal SVC. No pneumothrorax.   eICU Interventions  Ok to use L IJ CVL.     Intervention Category Intermediate Interventions: Diagnostic test evaluation  [name],[name] Ann 11/08/2015, 4:44 PM

## 2015-11-08 NOTE — Procedures (Signed)
Central Venous Catheter Insertion Procedure Note Eugene Gaviaancy A Pingree [id number] 1975-08-09  Procedure: Insertion of Central Venous Catheter Indications: Assessment of intravascular volume and Drug and/or fluid administration  Procedure Details Consent: Risks of procedure as well as the alternatives and risks of each were explained to the (patient/caregiver).  Consent for procedure obtained. Time Out: Verified patient identification, verified procedure, site/side was marked, verified correct patient position, special equipment/implants available, medications/allergies/relevent history reviewed, required imaging and test results available.  Performed Real time US was used to ID and cannulate the vessel  Maximum sterile technique was used including antiseptics, cap, gloves, gown, hand hygiene, mask and sheet. Skin prep: Chlorhexidine; local anesthetic administered A antimicrobial bonded/coated triple lumen catheter was placed in the left internal jugular vein using the Seldinger technique.  Evaluation Blood flow good Complications: No apparent complications Patient did tolerate procedure well. Chest X-ray ordered to verify placement.  CXR: pending.  Shelby Mattocksete E Bethel Sirois 11/08/2015, 3:54 PM  Simonne MartinetPeter E Mcguire Gasparyan ACNP-BC [facility] Pager # [phone] OR # [phone] if no answer

## 2015-11-08 NOTE — Progress Notes (Signed)
PROGRESS NOTE    Ann Gaviaancy A Alas  AVW:[id number] DOB: 1975-09-27 DOA: 11/07/2015 PCP: Elizabeth PalauANDERSON,TERESA, FNP    Brief Narrative:  Ann Yang is a 40 y.o. female with medical history significant of MR, seizure disorder, anxiety, depression. Presenting to the hospital after aspiration event requiring Heimlich maneuver. Patient was evaluated and found to be hypoxic and subsequently presented to the hospital for further evaluation.    Assessment & Plan:   Aspiration pneumonia (HCC) - Continue in step down and cover with Rocephin and azithromycin. - PCCM following.  Hypotension - patient is still alert and awake. Continue normal saline administration.  - PCCM on board.  Seizure - Continue home medication regimen  Depression - Continue home antidepressant  Anxiety - Patient is on Xanax. We'll continue home medication regimen as I am trying to avoid withdrawal  DVT prophylaxis: heparin Code Status: full Family Communication: none at bedside. Disposition Plan: pending improvement in condition   Consultants:   Pulmonology   Procedures: None   Antimicrobials: Azithromycin and Rocephin   Subjective: Pt does not interact with me verbally. Gazes in my direction and smiles  Objective: Filed Vitals:   11/07/15 2300 11/07/15 2337 11/08/15 0000 11/08/15 0320  BP:  94/42  90/43  Pulse:   84 95  Temp:      TempSrc:      Resp:   22 30  Height: 4\' 11"  (1.499 m)     Weight: 41.4 kg (91 lb 4.3 oz)     SpO2:   99% 93%    Intake/Output Summary (Last 24 hours) at 11/08/15 1011 Last data filed at 11/08/15 0500  Gross per 24 hour  Intake 1007.5 ml  Output      0 ml  Net 1007.5 ml   Filed Weights   11/07/15 2300  Weight: 41.4 kg (91 lb 4.3 oz)    Examination:  General exam: Appears calm and comfortable  Respiratory system: rhales worse at right lung base, no wheezes, equal chest rise. Cardiovascular system: S1 & S2 heard, no rubs Gastrointestinal system: pt did not  allow for examination Central nervous system: gazes in my direction and smiles, limited interaction and difficult exam due to MR Extremities: Symmetric 5 x 5 power. Skin: No rashes, lesions or ulcers on limited exam Psychiatry: unable to assess due to MR    Data Reviewed: I have personally reviewed following labs and imaging studies  CBC:  Recent Labs Lab 11/07/15 1400  WBC 6.1  NEUTROABS 5.3  HGB 13.9  HCT 41.0  MCV 97.9  PLT 123*   Basic Metabolic Panel:  Recent Labs Lab 11/07/15 1400 11/08/15 0940  NA 144 139  K 4.7 4.1  CL 109 107  CO2 26 25  GLUCOSE 148* 191*  BUN 31* 18  CREATININE 1.55* 1.07*  CALCIUM 8.8* 8.0*   GFR: Estimated Creatinine Clearance: 45.7 mL/min (by C-G formula based on Cr of 1.07). Liver Function Tests:  Recent Labs Lab 11/07/15 1400  AST 31  ALT 19  ALKPHOS 54  BILITOT 1.2  PROT 7.8  ALBUMIN 4.1   No results for input(s): LIPASE, AMYLASE in the last 168 hours. No results for input(s): AMMONIA in the last 168 hours. Coagulation Profile: No results for input(s): INR, PROTIME in the last 168 hours. Cardiac Enzymes: No results for input(s): CKTOTAL, CKMB, CKMBINDEX, TROPONINI in the last 168 hours. BNP (last 3 results) No results for input(s): PROBNP in the last 8760 hours. HbA1C: No results for input(s): HGBA1C in the  last 72 hours. CBG: No results for input(s): GLUCAP in the last 168 hours. Lipid Profile: No results for input(s): CHOL, HDL, LDLCALC, TRIG, CHOLHDL, LDLDIRECT in the last 72 hours. Thyroid Function Tests: No results for input(s): TSH, T4TOTAL, FREET4, T3FREE, THYROIDAB in the last 72 hours. Anemia Panel: No results for input(s): VITAMINB12, FOLATE, FERRITIN, TIBC, IRON, RETICCTPCT in the last 72 hours. Sepsis Labs:  Recent Labs Lab 11/07/15 1409  LATICACIDVEN 3.75*    Recent Results (from the past 240 hour(s))  MRSA PCR Screening     Status: None   Collection Time: 11/07/15  9:48 PM  Result Value Ref  Range Status   MRSA by PCR NEGATIVE NEGATIVE Final    Comment:        The GeneXpert MRSA Assay (FDA approved for NASAL specimens only), is one component of a comprehensive MRSA colonization surveillance program. It is not intended to diagnose MRSA infection nor to guide or monitor treatment for MRSA infections.      Radiology Studies: Dg Chest 2 View  11/07/2015  CLINICAL DATA:  Aspirated a hot dog last night, dislodged by Heimlich maneuver, shortness of breath since incident, history Down syndrome and aspiration pneumonia EXAM: CHEST  2 VIEW COMPARISON:  04/01/2015 FINDINGS: Borderline enlargement of cardiac silhouette. Mediastinal contours and pulmonary vascularity normal. Bibasilar infiltrates which may represent aspiration or pneumonia. Small amount of infiltrate or atelectasis in RIGHT upper lobe above minor fissure. Upper lungs otherwise clear. No pleural effusion or pneumothorax. Bones unremarkable. IMPRESSION: Bibasilar infiltrates which could represent pneumonia or aspiration. Electronically Signed   By: Ulyses SouthwardMark  Boles M.D.   On: 11/07/2015 13:50    Scheduled Meds: . ALPRAZolam  0.5 mg Oral BID  . azithromycin  500 mg Intravenous Q24H  . cefTRIAXone (ROCEPHIN)  IV  1 g Intravenous Q24H  . donepezil  10 mg Oral QHS  . heparin  5,000 Units Subcutaneous Q8H  . lamoTRIgine  100 mg Oral BID  . levothyroxine  50 mcg Oral QAC breakfast  . loratadine  10 mg Oral QHS  . risperiDONE  2 mg Oral QHS  . sertraline  100 mg Oral QHS   Continuous Infusions: . sodium chloride 75 mL/hr at 11/07/15 2126     LOS: 1 day    Time spent: > 35 minutes   Penny PiaVEGA, Viviene Thurston, MD [facility] Pager [phone]  If 7PM-7AM, please contact night-coverage [email or web address] Password [email or web address] 11/08/2015, 10:11 AM

## 2015-11-08 NOTE — Procedures (Signed)
Intubation Procedure Note Ann Yang [id number] 1975/11/18  Procedure: Intubation Indications: Respiratory insufficiency  Procedure Details Consent: Unable to obtain consent because of emergent medical necessity. Time Out: Verified patient identification, verified procedure, site/side was marked, verified correct patient position, special equipment/implants available, medications/allergies/relevent history reviewed, required imaging and test results available.  Performed  Maximum sterile technique was used including antiseptics, cap, gloves, hand hygiene and mask.  MAC and 3 Glide scope 3 7 tube   Evaluation Hemodynamic Status: BP stable throughout; O2 sats: desaturated, but responding to BMV Patient's Current Condition: stable Complications: Complications of vomited during BMV, airway suctioned.  Patient did tolerate procedure well. Chest X-ray ordered to verify placement.  CXR: pending.   Shelby Mattocksete E Kinser Fellman 11/08/2015  Simonne MartinetPeter E Ralpheal Zappone ACNP-BC [facility] Pager # [phone] OR # [phone] if no answer

## 2015-11-08 NOTE — Progress Notes (Addendum)
Initial Nutrition Assessment  DOCUMENTATION CODES:   Non-severe (moderate) malnutrition in context of chronic illness, Underweight  INTERVENTION:  - Will order Jevity 1.2 @ 45 mL/hr with 30 mL free water every 4 hours. This regimen + kcal from current Propofol rate will provide 1328 kcal (94% estimated kcal needs), 60 grams of protein, and 992 mL free water.  - Start TF at goal rate and follow PEPuP Protocol.  - RD will follow-up 6/26.  NUTRITION DIAGNOSIS:   Inadequate oral intake related to inability to eat as evidenced by NPO status.  GOAL:   Patient will meet greater than or equal to 90% of their needs  MONITOR:   Vent status, TF tolerance, Weight trends, Labs, I & O's  REASON FOR ASSESSMENT:   Ventilator, Consult Enteral/tube feeding initiation and management  ASSESSMENT:   40 y.o. female with medical history significant of MR, seizure disorder, anxiety, depression. Presenting to the hospital after aspiration event requiring Heimlich maneuver. Patient was evaluated and found to be hypoxic and subsequently presented to the hospital for further evaluation. Unable to obtain history secondary to advanced MR  Pt seen for new vent and TF consult. BMI indicates underweight status. No family/visitors present at this time. Per notes, pt admitted following aspiration after eating a hot dog. Pt intubated today ~1200.  Patient is currently intubated on ventilator support with OGT in place. MV: 8.4 L/min Temp (24hrs), Avg:101.2 F (38.4 C), Min:101.2 F (38.4 C), Max:101.2 F (38.4 C) Propofol: 1.2 mL/hr (32 kcal)  Physical assessment shows mild fat wasting and mild and moderate muscle wasting to upper body. Per chart review, pt has lost 7 lbs (7% body weight) in the past 6 months which is not significant for time frame.   Not able to meet needs currently. Will order TF as outlined above. Medications reviewed. Labs reviewed; Ca: 8 mg/dL.  IVF: NS @ 75 mL/hr. Drips: Propofol @ 5  mcg/kg/min, Fentanyl @ 50 mcg/hr.     Diet Order:  Diet NPO time specified  Skin:  Reviewed, no issues  Last BM:  6/21  Height:   Ht Readings from Last 1 Encounters:  11/07/15 4\' 11"  (1.499 m)    Weight:   Wt Readings from Last 1 Encounters:  11/07/15 91 lb 4.3 oz (41.4 kg)    Ideal Body Weight:  42.91 kg (kg)  BMI:  Body mass index is 18.42 kg/(m^2).  Estimated Nutritional Needs:   Kcal:  1414  Protein:  50-62 grams (1.2-1.5 grams/kg)  Fluid:  1.5 L/day  EDUCATION NEEDS:   No education needs identified at this time     Trenton GammonJessica Reve Crocket, MS, RD, LDN Inpatient Clinical Dietitian Pager # [phone] After hours/weekend pager # [phone]

## 2015-11-08 NOTE — Progress Notes (Signed)
PULMONARY / CRITICAL CARE MEDICINE   Name: Ann Yang MRN: [id number] DOB: [birth year]    ADMISSION DATE:  11/07/2015 CONSULTATION DATE:  11/07/2015  REFERRING MD:  Dr. Cena BentonVega  CHIEF COMPLAINT:  Short of breath  SUBJECTIVE:  Says she has a cough.  VITAL SIGNS: BP 90/43 mmHg  Pulse 95  Temp(Src) 101.2 F (38.4 C) (Oral)  Resp 30  Ht 4\' 11"  (1.499 m)  Wt 91 lb 4.3 oz (41.4 kg)  BMI 18.42 kg/m2  SpO2 93%  HEMODYNAMICS:    VENTILATOR SETTINGS:    INTAKE / OUTPUT: I/O last 3 completed shifts: In: 1007.5 [P.O.:440; I.V.:567.5] Out: 0   PHYSICAL EXAMINATION: General:  alert Neuro:  Follows simple commands HEENT:  Pupils reactive Cardiovascular:  Regular, no murmur Lungs:  Basilar crackles Abdomen:  Soft, non tender Musculoskeletal:  No edema Skin:  No rashes  LABS:  BMET  Recent Labs Lab 11/07/15 1400  NA 144  K 4.7  CL 109  CO2 26  BUN 31*  CREATININE 1.55*  GLUCOSE 148*    Electrolytes  Recent Labs Lab 11/07/15 1400  CALCIUM 8.8*    CBC  Recent Labs Lab 11/07/15 1400  WBC 6.1  HGB 13.9  HCT 41.0  PLT 123*    Coag's No results for input(s): APTT, INR in the last 168 hours.  Sepsis Markers  Recent Labs Lab 11/07/15 1409  LATICACIDVEN 3.75*    ABG No results for input(s): PHART, PCO2ART, PO2ART in the last 168 hours.  Liver Enzymes  Recent Labs Lab 11/07/15 1400  AST 31  ALT 19  ALKPHOS 54  BILITOT 1.2  ALBUMIN 4.1    Cardiac Enzymes No results for input(s): TROPONINI, PROBNP in the last 168 hours.  Glucose No results for input(s): GLUCAP in the last 168 hours.  Imaging Dg Chest 2 View  11/07/2015  CLINICAL DATA:  Aspirated a hot dog last night, dislodged by Heimlich maneuver, shortness of breath since incident, history Down syndrome and aspiration pneumonia EXAM: CHEST  2 VIEW COMPARISON:  04/01/2015 FINDINGS: Borderline enlargement of cardiac silhouette. Mediastinal contours and pulmonary vascularity normal.  Bibasilar infiltrates which may represent aspiration or pneumonia. Small amount of infiltrate or atelectasis in RIGHT upper lobe above minor fissure. Upper lungs otherwise clear. No pleural effusion or pneumothorax. Bones unremarkable. IMPRESSION: Bibasilar infiltrates which could represent pneumonia or aspiration. Electronically Signed   By: Ulyses SouthwardMark  Boles M.D.   On: 11/07/2015 13:50     STUDIES:    CULTURES: 6/22 Blood >>  ANTIBIOTICS: 6/22 Rocephin >> 6/22 Zithromax  SIGNIFICANT EVENTS: 6/22 Admit  LINES/TUBES:   DISCUSSION: 40 yo female with aspiration pneumonia, acute hypoxic respiratory failure after choking on hot dog.  ASSESSMENT / PLAN:  PULMONARY A: Acute hypoxic respiratory failure 2nd to aspiration pneumonia. P:   F/u CXR Oxygen to keep SpO2 > 92% Need to d/w caregiver about goals of care  CARDIOVASCULAR A:  Hypotension likely from hypovolemia and sepsis. P:  Continue IV fluids Monitor hemodynamics  RENAL A:   AKI. P:   Monitor renal fx, urine outpt  GASTROINTESTINAL A:   Dysphagia. P:   Monitor for recurrent aspiration  HEMATOLOGIC A:   Thrombocytopenia. P:  F/u CBC SQ heparin for DVT prophylaxis  INFECTIOUS A:   Aspiration pneumonia. P:   Day 2 of ABx  ENDOCRINE A:   Hx of hypothyroidism. P:   Continue synthroid  NEUROLOGIC A:   Hx of Down syndrome, mental retardation, anxiety, depression. Hx of Seizures. P:  Continue xanax, aricept, lamictal, risperdal, zoloft  CC time 31 minutes.  Coralyn HellingVineet Chezney Huether, MD [facility] 11/08/2015, 9:00 AM Pager:  [phone] After 3pm call: [phone]

## 2015-11-09 ENCOUNTER — Inpatient Hospital Stay (HOSPITAL_COMMUNITY): Payer: Medicare Other

## 2015-11-09 DIAGNOSIS — A419 Sepsis, unspecified organism: Principal | ICD-10-CM

## 2015-11-09 DIAGNOSIS — R6521 Severe sepsis with septic shock: Secondary | ICD-10-CM

## 2015-11-09 DIAGNOSIS — J80 Acute respiratory distress syndrome: Secondary | ICD-10-CM

## 2015-11-09 LAB — BASIC METABOLIC PANEL
Anion gap: 4 — ABNORMAL LOW (ref 5–15)
BUN: 11 mg/dL (ref 6–20)
CALCIUM: 7.3 mg/dL — AB (ref 8.9–10.3)
CO2: 27 mmol/L (ref 22–32)
CREATININE: 0.92 mg/dL (ref 0.44–1.00)
Chloride: 104 mmol/L (ref 101–111)
GLUCOSE: 136 mg/dL — AB (ref 65–99)
Potassium: 3.8 mmol/L (ref 3.5–5.1)
Sodium: 135 mmol/L (ref 135–145)

## 2015-11-09 LAB — CBC
HCT: 30.5 % — ABNORMAL LOW (ref 36.0–46.0)
Hemoglobin: 10.3 g/dL — ABNORMAL LOW (ref 12.0–15.0)
MCH: 32.7 pg (ref 26.0–34.0)
MCHC: 33.8 g/dL (ref 30.0–36.0)
MCV: 96.8 fL (ref 78.0–100.0)
Platelets: 104 10*3/uL — ABNORMAL LOW (ref 150–400)
RBC: 3.15 MIL/uL — ABNORMAL LOW (ref 3.87–5.11)
RDW: 13.8 % (ref 11.5–15.5)
WBC: 2.9 10*3/uL — ABNORMAL LOW (ref 4.0–10.5)

## 2015-11-09 LAB — GLUCOSE, CAPILLARY
GLUCOSE-CAPILLARY: 220 mg/dL — AB (ref 65–99)
GLUCOSE-CAPILLARY: 159 mg/dL — AB (ref 65–99)
GLUCOSE-CAPILLARY: 211 mg/dL — AB (ref 65–99)
Glucose-Capillary: 120 mg/dL — ABNORMAL HIGH (ref 65–99)
Glucose-Capillary: 175 mg/dL — ABNORMAL HIGH (ref 65–99)
Glucose-Capillary: 173 mg/dL — ABNORMAL HIGH (ref 65–99)

## 2015-11-09 LAB — MAGNESIUM
Magnesium: 2.3 mg/dL (ref 1.7–2.4)
Magnesium: 2 mg/dL (ref 1.7–2.4)

## 2015-11-09 LAB — PHOSPHORUS
Phosphorus: 2.3 mg/dL — ABNORMAL LOW (ref 2.5–4.6)
Phosphorus: 1.9 mg/dL — ABNORMAL LOW (ref 2.5–4.6)

## 2015-11-09 MED ORDER — VASOPRESSIN 20 UNIT/ML IV SOLN
0.0300 [IU]/min | INTRAVENOUS | Status: DC
  Administered 2015-11-09 – 2015-11-12 (×4): 0.03 [IU]/min via INTRAVENOUS
  Filled 2015-11-09 (×4): qty 2

## 2015-11-09 MED ORDER — POTASSIUM & SODIUM PHOSPHATES 280-160-250 MG PO PACK
2.0000 | PACK | Freq: Three times a day (TID) | ORAL | Status: AC
  Administered 2015-11-09 (×3): 2 via ORAL
  Filled 2015-11-09 (×3): qty 2

## 2015-11-09 MED ORDER — FREE WATER
200.0000 mL | Freq: Four times a day (QID) | Status: DC
  Administered 2015-11-09 – 2015-11-10 (×4): 200 mL

## 2015-11-09 MED ORDER — INSULIN ASPART 100 UNIT/ML ~~LOC~~ SOLN
0.0000 [IU] | SUBCUTANEOUS | Status: DC
  Administered 2015-11-09: 3 [IU] via SUBCUTANEOUS
  Administered 2015-11-09 – 2015-11-10 (×3): 2 [IU] via SUBCUTANEOUS
  Administered 2015-11-10: 1 [IU] via SUBCUTANEOUS
  Administered 2015-11-10 (×2): 2 [IU] via SUBCUTANEOUS
  Administered 2015-11-11 (×2): 1 [IU] via SUBCUTANEOUS
  Administered 2015-11-11: 2 [IU] via SUBCUTANEOUS
  Administered 2015-11-11 (×3): 1 [IU] via SUBCUTANEOUS
  Administered 2015-11-12: 2 [IU] via SUBCUTANEOUS
  Administered 2015-11-12 (×2): 1 [IU] via SUBCUTANEOUS
  Administered 2015-11-12: 2 [IU] via SUBCUTANEOUS
  Administered 2015-11-12 (×2): 1 [IU] via SUBCUTANEOUS
  Administered 2015-11-13: 2 [IU] via SUBCUTANEOUS
  Administered 2015-11-13: 1 [IU] via SUBCUTANEOUS
  Administered 2015-11-13: 2 [IU] via SUBCUTANEOUS
  Administered 2015-11-13: 1 [IU] via SUBCUTANEOUS
  Administered 2015-11-13 – 2015-11-14 (×3): 2 [IU] via SUBCUTANEOUS
  Administered 2015-11-14 (×2): 1 [IU] via SUBCUTANEOUS
  Administered 2015-11-14 (×2): 2 [IU] via SUBCUTANEOUS
  Administered 2015-11-14: 1 [IU] via SUBCUTANEOUS
  Administered 2015-11-15: 2 [IU] via SUBCUTANEOUS
  Administered 2015-11-15: 1 [IU] via SUBCUTANEOUS
  Administered 2015-11-15: 2 [IU] via SUBCUTANEOUS
  Administered 2015-11-15: 3 [IU] via SUBCUTANEOUS
  Administered 2015-11-15: 1 [IU] via SUBCUTANEOUS

## 2015-11-09 MED ORDER — NOREPINEPHRINE BITARTRATE 1 MG/ML IV SOLN
0.0000 ug/min | INTRAVENOUS | Status: DC
  Administered 2015-11-09: 2 ug/min via INTRAVENOUS
  Administered 2015-11-09: 14 ug/min via INTRAVENOUS
  Administered 2015-11-09: 15 ug/min via INTRAVENOUS
  Administered 2015-11-10: 14 ug/min via INTRAVENOUS
  Administered 2015-11-10: 17 ug/min via INTRAVENOUS
  Administered 2015-11-11 (×2): 11 ug/min via INTRAVENOUS
  Administered 2015-11-12: 9 ug/min via INTRAVENOUS
  Administered 2015-11-12 (×2): 11 ug/min via INTRAVENOUS
  Administered 2015-11-13: 13 ug/min via INTRAVENOUS
  Administered 2015-11-13: 5 ug/min via INTRAVENOUS
  Administered 2015-11-13: 14 ug/min via INTRAVENOUS
  Administered 2015-11-14 (×2): 15 ug/min via INTRAVENOUS
  Administered 2015-11-14: 5 ug/min via INTRAVENOUS
  Administered 2015-11-15: 10 ug/min via INTRAVENOUS
  Administered 2015-11-15: 12 ug/min via INTRAVENOUS
  Administered 2015-11-15: 10 ug/min via INTRAVENOUS
  Filled 2015-11-09 (×23): qty 4

## 2015-11-09 NOTE — Progress Notes (Signed)
PULMONARY / CRITICAL CARE MEDICINE   Name: Ann Yang MRN: [id number] DOB: 07/20/1975    ADMISSION DATE:  11/07/2015 CONSULTATION DATE:  11/07/2015  REFERRING MD:  Dr. Cena BentonVega  CHIEF COMPLAINT:  Short of breath  40 yo female with aspiration pneumonia, acute hypoxic respiratory failure after choking on hot dog.  PMH - Down syndrome /mental retardation and seizure disorder anxiety and depression, resident of assisted living facility  SUBJECTIVE:  Worsening shock requiring pressors Afebrile Good UO  VITAL SIGNS: BP 98/51 mmHg  Pulse 105  Temp(Src) 99.1 F (37.3 C) (Axillary)  Resp 18  Ht 4\' 11"  (1.499 m)  Wt 91 lb 4.3 oz (41.4 kg)  BMI 18.42 kg/m2  SpO2 93%  HEMODYNAMICS:    VENTILATOR SETTINGS: Vent Mode:  [-] PRVC FiO2 (%):  [70 %-100 %] 80 % Set Rate:  [18 bmp] 18 bmp Vt Set:  [350 mL] 350 mL PEEP:  [5 cmH20] 5 cmH20 Plateau Pressure:  [9 cmH20-18 cmH20] 18 cmH20  INTAKE / OUTPUT: I/O last 3 completed shifts: In: 4994.6 [P.O.:440; I.V.:3633.6; NG/GT:771; IV Piggyback:150] Out: 1150 [Urine:1150]  PHYSICAL EXAMINATION: General:  Intubated ,s edated, chr ill appearing Neuro:  Sedated , RASS-3 HEENT:  Pupils reactive, no jvd Cardiovascular:  Regular, no murmur Lungs: BL  Basilar crackles Abdomen:  Soft, non tender Musculoskeletal:  No edema Skin:  No rashes  LABS:  BMET  Recent Labs Lab 11/07/15 1400 11/08/15 0940 11/09/15 0730  NA 144 139 135  K 4.7 4.1 3.8  CL 109 107 104  CO2 26 25 27   BUN 31* 18 11  CREATININE 1.55* 1.07* 0.92  GLUCOSE 148* 191* 136*    Electrolytes  Recent Labs Lab 11/07/15 1400 11/08/15 0940 11/08/15 1321 11/08/15 1705 11/09/15 0730  CALCIUM 8.8* 8.0*  --   --  7.3*  MG  --   --  1.6* 1.6* 2.3  PHOS  --   --  2.0* 1.7* 2.3*    CBC  Recent Labs Lab 11/07/15 1400 11/08/15 0940 11/09/15 0730  WBC 6.1 4.0 2.9*  HGB 13.9 12.4 10.3*  HCT 41.0 35.8* 30.5*  PLT 123* 88* 104*    Coag's No results for  input(s): APTT, INR in the last 168 hours.  Sepsis Markers  Recent Labs Lab 11/07/15 1409  LATICACIDVEN 3.75*    ABG  Recent Labs Lab 11/08/15 1230  PHART 7.328*  PCO2ART 47.9*  PO2ART 86.1    Liver Enzymes  Recent Labs Lab 11/07/15 1400  AST 31  ALT 19  ALKPHOS 54  BILITOT 1.2  ALBUMIN 4.1    Cardiac Enzymes No results for input(s): TROPONINI, PROBNP in the last 168 hours.  Glucose  Recent Labs Lab 11/08/15 2109 11/09/15 0102 11/09/15 0516 11/09/15 0842  GLUCAP 166* 120* 175* 220*    Imaging Dg Abd 1 View  11/08/2015  CLINICAL DATA:  Orogastric tube placement EXAM: ABDOMEN - 1 VIEW COMPARISON:  Chest x-ray of today's date FINDINGS: The orogastric tube tip and proximal port lie in the gastric body. The stomach is distended with gas. Bibasilar pneumonia is present. The endotracheal tube tip lies 2.2 cm above the carina. IMPRESSION: Appropriate positioning of the orogastric tube. The stomach is moderately distended with gas. Electronically Signed   By: David  SwazilandJordan M.D.   On: 11/08/2015 12:55   Dg Chest Port 1 View  11/09/2015  CLINICAL DATA:  Respiratory failure. EXAM: PORTABLE CHEST 1 VIEW COMPARISON:  11/08/2015 FINDINGS: Endotracheal tube remains with the tip approximately 2  cm above the carina. Left jugular central line shows stable positioning with the catheter tip in the SVC. Lower lungs show persistent bilateral airspace disease, right greater than left likely representing pneumonia. Aeration of the right lung shows slight improvement. No significant edema, pleural fluid or evidence of pneumothorax. The heart size is stable and within normal limits. IMPRESSION: Persistent bilateral lower lung airspace disease, right greater than left, likely representing bilateral pneumonia. Aeration of the right lung is slightly improved. Electronically Signed   By: Irish LackGlenn  Yamagata M.D.   On: 11/09/2015 08:31   Dg Chest Port 1 View  11/08/2015  CLINICAL DATA:  Central  line placement EXAM: PORTABLE CHEST 1 VIEW COMPARISON:  Chest radiograph from earlier today. FINDINGS: Left internal jugular central venous catheter terminates in the lower third of the superior vena cava. Endotracheal tube tip is 2.2 cm above the carina. Enteric tube enters stomach with the tip not seen on this image. Stable cardiomediastinal silhouette with mild cardiomegaly. No pneumothorax. Stable small bilateral pleural effusions. Stable extensive patchy consolidation in the mid to lower lungs bilaterally. IMPRESSION: 1. Well-positioned left internal jugular central venous catheter with no pneumothorax. Additional support structures as described. 2. Stable extensive patchy consolidation in the mid to lower lungs bilaterally, favor multilobar pneumonia and/or atelectasis. 3. Stable small bilateral pleural effusions. Electronically Signed   By: Delbert PhenixJason A Poff M.D.   On: 11/08/2015 16:19   Dg Chest Port 1 View  11/08/2015  CLINICAL DATA:  Respiratory failure.  Down's syndrome. EXAM: PORTABLE CHEST 1 VIEW COMPARISON:  11/07/2015 FINDINGS: Endotracheal tube is 3 cm above the carina. Bilateral perihilar and lower lobe airspace opacities, right greater than left, worsening since prior study. Possible small effusions. Heart is likely upper limits normal in size. No acute bony abnormality. IMPRESSION: Bilateral perihilar and lower lobe airspace opacities, worsening since prior study. This is greater on the right. Possible small bilateral effusions. Endotracheal tube 3 cm above the carina. Electronically Signed   By: Charlett NoseKevin  Dover M.D.   On: 11/08/2015 12:29     STUDIES:    CULTURES: 6/22 Blood >> 6/23 resp >>  ANTIBIOTICS: 6/22 Rocephin >> 6/22 Zithromax  SIGNIFICANT EVENTS: 6/22 Admit 6/23 intubated - would not keep O2 mask on  LINES/TUBES: 6/23 ETT >>   DISCUSSION:   ASSESSMENT / PLAN:  PULMONARY A: Acute hypoxic respiratory failure/ ARDS  2nd to aspiration pneumonia. P:   Low Tv  ventilation Add PEEP & ttirate per FIO2/ARDS protocol  CARDIOVASCULAR A: Septic shock  P: Add vaso, titrate neo to off , keep levo Continue IV fluids   RENAL A:   AKI -resolved Hypophos P:   Monitor renal fx, urine outpt  GASTROINTESTINAL A:   Dysphagia. P:   Monitor for recurrent aspiration Start TFs  HEMATOLOGIC A:   Thrombocytopenia. P:  F/u CBC SQ heparin for DVT prophylaxis  INFECTIOUS A:   Aspiration pneumonia. P:   Day 2 of ABx -await cx data  ENDOCRINE A:   Hx of hypothyroidism. P:   Continue synthroid  NEUROLOGIC A:   Hx of Down syndrome, mental retardation, anxiety, depression. Hx of Seizures. P:   Continue xanax, aricept, lamictal, risperdal, zoloft  CC time 35 minutes.  Cyril Mourningakesh Alva MD. Tonny BollmanFCCP. [place] [facility] Pager [phone] If no response call [phone]    11/09/2015, 10:03 AM

## 2015-11-09 NOTE — Progress Notes (Signed)
eLink Physician-Brief Progress Note Patient Name: Ann Yang DOB: 01-26-1976 MRN: [id number]   Date of Service  11/09/2015  HPI/Events of Note  Persistent hypotension despite Neo-Synephrine at 200. Likely secondary to sedation.   eICU Interventions  Start Levophed & titrate for goal MAP & SBP     Intervention Category Major Interventions: Hypotension - evaluation and management  Lawanda CousinsJennings Rollan Roger 11/09/2015, 12:27 AM

## 2015-11-09 NOTE — Progress Notes (Signed)
Patient intubated. PCCM to manage medical issues while she is intubated. Please call flow manager at [phone] once medical condition improves for resumption of care.  Analyah Mcconnon, [employer]

## 2015-11-09 NOTE — Plan of Care (Signed)
Problem: Nutritional: Goal: Intake of prescribed amount of daily calories will improve Outcome: Progressing Tolerating tube feed

## 2015-11-09 NOTE — Progress Notes (Signed)
eLink Physician-Brief Progress Note Patient Name: Ann Yang DOB: Nov 30, 1975 MRN: [id number]   Date of Service  11/09/2015  HPI/Events of Note  Blood glucose = 204.  eICU Interventions  Will order Q 4 hour sensitive Novolog SSI.     Intervention Category Intermediate Interventions: Hyperglycemia - evaluation and treatment  [name] 11/09/2015, 8:12 PM

## 2015-11-10 ENCOUNTER — Inpatient Hospital Stay (HOSPITAL_COMMUNITY): Payer: Medicare Other

## 2015-11-10 LAB — BLOOD GAS, ARTERIAL
ACID-BASE EXCESS: 0.8 mmol/L (ref 0.0–2.0)
Bicarbonate: 28.1 mEq/L — ABNORMAL HIGH (ref 20.0–24.0)
Drawn by: 405301
FIO2: 0.6
LHR: 18 {breaths}/min
MECHVT: 350 mL
O2 SAT: 99 %
PEEP/CPAP: 10 cmH2O
PH ART: 7.28 — AB (ref 7.350–7.450)
PO2 ART: 143 mmHg — AB (ref 80.0–100.0)
Patient temperature: 98.6
TCO2: 26.4 mmol/L (ref 0–100)
pCO2 arterial: 61.7 mmHg (ref 35.0–45.0)

## 2015-11-10 LAB — CBC
HEMATOCRIT: 30.6 % — AB (ref 36.0–46.0)
HEMOGLOBIN: 10.2 g/dL — AB (ref 12.0–15.0)
MCH: 33.3 pg (ref 26.0–34.0)
MCHC: 33.3 g/dL (ref 30.0–36.0)
MCV: 100 fL (ref 78.0–100.0)
PLATELETS: 82 10*3/uL — AB (ref 150–400)
RBC: 3.06 MIL/uL — AB (ref 3.87–5.11)
RDW: 13.9 % (ref 11.5–15.5)
WBC: 1.9 10*3/uL — AB (ref 4.0–10.5)

## 2015-11-10 LAB — GLUCOSE, CAPILLARY
GLUCOSE-CAPILLARY: 131 mg/dL — AB (ref 65–99)
GLUCOSE-CAPILLARY: 167 mg/dL — AB (ref 65–99)
GLUCOSE-CAPILLARY: 178 mg/dL — AB (ref 65–99)
GLUCOSE-CAPILLARY: 181 mg/dL — AB (ref 65–99)
Glucose-Capillary: 162 mg/dL — ABNORMAL HIGH (ref 65–99)

## 2015-11-10 LAB — BASIC METABOLIC PANEL
ANION GAP: 3 — AB (ref 5–15)
BUN: 11 mg/dL (ref 6–20)
CHLORIDE: 101 mmol/L (ref 101–111)
CO2: 30 mmol/L (ref 22–32)
CREATININE: 0.71 mg/dL (ref 0.44–1.00)
Calcium: 7.1 mg/dL — ABNORMAL LOW (ref 8.9–10.3)
GFR calc non Af Amer: >60 mL/min (ref >60)
Glucose, Bld: 205 mg/dL — ABNORMAL HIGH (ref 65–99)
POTASSIUM: 4.3 mmol/L (ref 3.5–5.1)
SODIUM: 134 mmol/L — AB (ref 135–145)

## 2015-11-10 NOTE — Progress Notes (Signed)
PULMONARY / CRITICAL CARE MEDICINE   Name: Ann Yang MRN: [id number] DOB: February 17, 1976    ADMISSION DATE:  11/07/2015 CONSULTATION DATE:  11/07/2015  REFERRING MD:  Dr. Cena BentonVega  CHIEF COMPLAINT:  Short of breath  40 yo female with aspiration pneumonia, acute hypoxic respiratory failure after choking on hot dog.  PMH - Down syndrome /mental retardation and seizure disorder anxiety and depression, resident of assisted living facility  SUBJECTIVE:  Remains critically ill on PEEP 10/ FIO2 coming down -on levo gtt, off neo Afebrile Int agitation  VITAL SIGNS: BP 95/53 mmHg  Pulse 85  Temp(Src) 97.9 F (36.6 C) (Oral)  Resp 18  Ht 4\' 11"  (1.499 m)  Wt 102 lb 1.2 oz (46.3 kg)  BMI 20.61 kg/m2  SpO2 92%  HEMODYNAMICS:    VENTILATOR SETTINGS: Vent Mode:  [-] PRVC FiO2 (%):  [50 %-80 %] 50 % Set Rate:  [18 bmp] 18 bmp Vt Set:  [350 mL] 350 mL PEEP:  [10 cmH20] 10 cmH20 Plateau Pressure:  [21 cmH20-23 cmH20] 21 cmH20  INTAKE / OUTPUT: I/O last 3 completed shifts: In: 8692 [I.V.:6012; NG/GT:2430; IV Piggyback:250] Out: 1945 [Urine:1945]  PHYSICAL EXAMINATION: General:  Intubated ,sedated, chr ill appearing Neuro:  Sedated , RASS-3 HEENT:  Pupils reactive, no jvd, downs features Cardiovascular:  Regular, no murmur Lungs: BL  Basilar crackles Abdomen:  Soft, non tender Musculoskeletal:  No edema Skin:  No rashes  LABS:  BMET  Recent Labs Lab 11/08/15 0940 11/09/15 0730 11/10/15 0430  NA 139 135 134*  K 4.1 3.8 4.3  CL 107 104 101  CO2 25 27 30   BUN 18 11 11   CREATININE 1.07* 0.92 0.71  GLUCOSE 191* 136* 205*    Electrolytes  Recent Labs Lab 11/08/15 0940  11/08/15 1705 11/09/15 0730 11/09/15 1700 11/10/15 0430  CALCIUM 8.0*  --   --  7.3*  --  7.1*  MG  --   < > 1.6* 2.3 2.0  --   PHOS  --   < > 1.7* 2.3* 1.9*  --   < > = values in this interval not displayed.  CBC  Recent Labs Lab 11/08/15 0940 11/09/15 0730 11/10/15 0430  WBC 4.0 2.9*  1.9*  HGB 12.4 10.3* 10.2*  HCT 35.8* 30.5* 30.6*  PLT 88* 104* 82*    Coag's No results for input(s): APTT, INR in the last 168 hours.  Sepsis Markers  Recent Labs Lab 11/07/15 1409  LATICACIDVEN 3.75*    ABG  Recent Labs Lab 11/08/15 1230 11/10/15 0434  PHART 7.328* 7.280*  PCO2ART 47.9* 61.7*  PO2ART 86.1 143*    Liver Enzymes  Recent Labs Lab 11/07/15 1400  AST 31  ALT 19  ALKPHOS 54  BILITOT 1.2  ALBUMIN 4.1    Cardiac Enzymes No results for input(s): TROPONINI, PROBNP in the last 168 hours.  Glucose  Recent Labs Lab 11/09/15 1231 11/09/15 1623 11/09/15 1951 11/09/15 2332 11/10/15 0425 11/10/15 0751  GLUCAP 159* 173* 211* 178* 167* 181*    Imaging Dg Chest Port 1 View  11/10/2015  CLINICAL DATA:  Acute hypoxemic respiratory failure, Down syndrome EXAM: PORTABLE CHEST 1 VIEW COMPARISON:  Portable exam 0456 hours compared to 11/09/2015 FINDINGS: Tip of endotracheal tube at carina. LEFT jugular central venous catheter tip projects over SVC near cavoatrial junction. Nasogastric tube extends into abdomen. Mild enlargement of cardiac silhouette. Pulmonary vascular congestion. BILATERAL lower lobe consolidation compatible with pneumonia, unchanged. Persistent RIGHT pleural effusion. No pneumothorax. IMPRESSION: Persistent  BILATERAL lower lobe infiltrates and small RIGHT pleural effusion little changed. Tip of endotracheal tube at carina, recommend withdrawal 3 cm. Findings called to Zoe RN on 11/10/2015 at 0751 hours. Electronically Signed   By: Ulyses SouthwardMark  Boles M.D.   On: 11/10/2015 07:52     STUDIES:    CULTURES: 6/22 Blood >>ng 6/23 resp >>  ANTIBIOTICS: 6/22 Rocephin >> 6/22 Zithromax  SIGNIFICANT EVENTS: 6/22 Admit 6/23 intubated - would not keep O2 mask on  LINES/TUBES: 6/23 ETT >> 6/23 lIJ >>   DISCUSSION: ARDS, septic shock with breakthrough agitation causing vent asynchrony  ASSESSMENT / PLAN:  PULMONARY A: Acute hypoxic  respiratory failure/ ARDS  2nd to aspiration pneumonia. P:   Low Tv ventilation, pH 7.28 acceptable Ct PEEP 10  & ttirate per FIO2/ARDS protocol  CARDIOVASCULAR A: Septic shock  P: Added vaso, neo  off , keep levo Continue IV fluids   RENAL A:   AKI -resolved Hypophos P:   Monitor renal fx, urine outpt  GASTROINTESTINAL A:   Dysphagia. P:   ctTFs  HEMATOLOGIC A:   Thrombocytopenia -123k on admit. Leucopenia - of infection P:  F/u CBC SQ heparin for DVT prophylaxis - stop if < 60k  INFECTIOUS A:   Aspiration pneumonia. P:   Ct ABx -await cx data  ENDOCRINE A:   Hx of hypothyroidism. P:   Continue synthroid  NEUROLOGIC A:   Hx of Down syndrome, mental retardation, anxiety, depression. Hx of Seizures. P:   Continue xanax, aricept, lamictal, risperdal, zoloft  CC time 35 minutes.  Cyril Mourningakesh Alva MD. Tonny BollmanFCCP. [facility] Pager [phone] If no response call [phone]    11/10/2015, 10:25 AM

## 2015-11-10 NOTE — Progress Notes (Signed)
eLink Physician-Brief Progress Note Patient Name: Ann Yang DOB: [name] MRN: [id number]   Date of Service  11/10/2015  HPI/Events of Note  Request to renew restraint orders.   eICU Interventions  Will renew restraint orders.      Intervention Category Minor Interventions: Agitation / anxiety - evaluation and management  [name],Steven Ann 11/10/2015, 8:54 PM

## 2015-11-10 NOTE — Progress Notes (Signed)
CRITICAL VALUE ALERT  Critical value received:  PCO2 61.7  Date of notification:  11/10/2015  Time of notification:  0440  Critical value read back:Yes.    Nurse who received alert:  Effie BerkshireAlex Kennis Buell  MD notified (1st page):  J Nestor  Time of first page:  0448  MD notified (2nd page):  Time of second page:  Responding MD:  Staci AcostaJ Nestor  Time MD responded:  [phone]

## 2015-11-10 NOTE — Progress Notes (Signed)
eLink Physician-Brief Progress Note Patient Name: Ann Yang DOB: November 15, 1975 MRN: [id number]   Date of Service  11/10/2015  HPI/Events of Note  Notified of morning ABG showing pH 7.28, PCO2 61.7, PO2 143. Currently on PRVC at 80 mL/kg ideal body weight. PEEP 10. FiO2 0.6.   eICU Interventions  1. Continuing to wean FiO2 for saturation >94% 2. Continuing current respiratory rate & tidal volume for minute ventilation 3. Holding on PEEP wean at this time to prevent derecruitment     Intervention Category Major Interventions: Respiratory failure - evaluation and management  Ann Yang 11/10/2015, 4:48 AM

## 2015-11-11 ENCOUNTER — Inpatient Hospital Stay (HOSPITAL_COMMUNITY): Payer: Medicare Other

## 2015-11-11 DIAGNOSIS — J189 Pneumonia, unspecified organism: Secondary | ICD-10-CM

## 2015-11-11 LAB — CBC
HCT: 28 % — ABNORMAL LOW (ref 36.0–46.0)
HEMOGLOBIN: 9.2 g/dL — AB (ref 12.0–15.0)
MCH: 31.7 pg (ref 26.0–34.0)
MCHC: 32.9 g/dL (ref 30.0–36.0)
MCV: 96.6 fL (ref 78.0–100.0)
PLATELETS: 85 10*3/uL — AB (ref 150–400)
RBC: 2.9 MIL/uL — AB (ref 3.87–5.11)
RDW: 13.5 % (ref 11.5–15.5)
WBC: 1.1 10*3/uL — AB (ref 4.0–10.5)

## 2015-11-11 LAB — BASIC METABOLIC PANEL
Anion gap: 4 — ABNORMAL LOW (ref 5–15)
BUN: 8 mg/dL (ref 6–20)
CHLORIDE: 101 mmol/L (ref 101–111)
CO2: 30 mmol/L (ref 22–32)
Calcium: 6.8 mg/dL — ABNORMAL LOW (ref 8.9–10.3)
Creatinine, Ser: 0.67 mg/dL (ref 0.44–1.00)
GFR calc non Af Amer: >60 mL/min (ref >60)
Glucose, Bld: 166 mg/dL — ABNORMAL HIGH (ref 65–99)
POTASSIUM: 3.5 mmol/L (ref 3.5–5.1)
SODIUM: 135 mmol/L (ref 135–145)

## 2015-11-11 LAB — URINE MICROSCOPIC-ADD ON
Bacteria, UA: NONE SEEN
RBC / HPF: NONE SEEN RBC/hpf (ref 0–5)
WBC UA: NONE SEEN WBC/hpf (ref 0–5)

## 2015-11-11 LAB — URINALYSIS, ROUTINE W REFLEX MICROSCOPIC
Bilirubin Urine: NEGATIVE
Glucose, UA: NEGATIVE mg/dL
Hgb urine dipstick: NEGATIVE
KETONES UR: NEGATIVE mg/dL
LEUKOCYTES UA: NEGATIVE
NITRITE: NEGATIVE
PH: 6 (ref 5.0–8.0)
Protein, ur: 100 mg/dL — AB
SPECIFIC GRAVITY, URINE: 1.031 — AB (ref 1.005–1.030)

## 2015-11-11 LAB — GLUCOSE, CAPILLARY
GLUCOSE-CAPILLARY: 141 mg/dL — AB (ref 65–99)
GLUCOSE-CAPILLARY: 137 mg/dL — AB (ref 65–99)
Glucose-Capillary: 147 mg/dL — ABNORMAL HIGH (ref 65–99)
Glucose-Capillary: 172 mg/dL — ABNORMAL HIGH (ref 65–99)
Glucose-Capillary: 136 mg/dL — ABNORMAL HIGH (ref 65–99)
Glucose-Capillary: 153 mg/dL — ABNORMAL HIGH (ref 65–99)
Glucose-Capillary: 125 mg/dL — ABNORMAL HIGH (ref 65–99)

## 2015-11-11 LAB — C DIFFICILE QUICK SCREEN W PCR REFLEX
C DIFFICILE (CDIFF) INTERP: NEGATIVE
C DIFFICILE (CDIFF) TOXIN: NEGATIVE
C Diff antigen: NEGATIVE

## 2015-11-11 LAB — TRIGLYCERIDES: TRIGLYCERIDES: 79 mg/dL (ref <150)

## 2015-11-11 MED ORDER — ACETAMINOPHEN 160 MG/5ML PO SOLN
650.0000 mg | Freq: Four times a day (QID) | ORAL | Status: DC | PRN
  Administered 2015-11-11 – 2015-11-14 (×4): 650 mg
  Filled 2015-11-11 (×4): qty 20.3

## 2015-11-11 NOTE — Progress Notes (Signed)
Nutrition Follow-up  DOCUMENTATION CODES:   Non-severe (moderate) malnutrition in context of chronic illness, Underweight  INTERVENTION:  - Continue current TF regimen: Jevity 1.2 @ 45 mL/hr.  - Continue PEPuP protocol. - RD will follow-up 6/27  NUTRITION DIAGNOSIS:   Inadequate oral intake related to inability to eat as evidenced by NPO status. -ongoing  GOAL:   Patient will meet greater than or equal to 90% of their needs -met with current TF regimen.  MONITOR:   Vent status, TF tolerance, Weight trends, Labs, I & O's  ASSESSMENT:   40 y.o. female with medical history significant of MR, seizure disorder, anxiety, depression. Presenting to the hospital after aspiration event requiring Heimlich maneuver. Patient was evaluated and found to be hypoxic and subsequently presented to the hospital for further evaluation. Unable to obtain history secondary to advanced MR  6/26 No family/visitors present at this time. Pt remains intubated with OGT in place and is currently receiving TF at goal rate: Jevity 1.2 @ 45 mL/hr which is providing 1296 kcal (88% estimated kcal needs), 60 grams of protein, and 872 mL free water. Nutrition needs updated this AM based on current Tmax and Ve. Current weight up 12 lbs since weight recorded at admission. Current weight consistent with weight from yesterday; will continue to monitor weight trends and adjust as needed.  Patient is currently intubated on ventilator support MV: 6.2 L/min Temp (24hrs), Avg:99.6 F (37.6 C), Min:97.9 F (36.6 C), Max:101.9 F (38.8 C) Propofol: 2.5 ml/hr (66 kcal).  Per MD note this AM, plan to start wake up and breath assessments. Spoke with RN in the room who reports inability to wean from vent at this time d/t ongoing need for pressor support.   Meeting needs at this time. Medications reviewed; sliding scale Novolog. Labs reviewed; CBGs: 136 and 153 mg/dL this AM, Ca: 6.8 mg/dL.  IVF: NS @ 75 mL/hr.  Drips:  Fentanyl @ 50 mcg/hr, Propofol @ 10 mcg/kg/min, Levo @ 13 mcg/min, Vaso @ 0.03 units/min.   6/23 - No family/visitors present at this time.  - Per notes, pt admitted following aspiration after eating a hot dog.  - Pt intubated today ~1200. - Patient is currently intubated on ventilator support with OGT in place.; MV: 8.4 L/min; Max:101.2 F (38.4 C); Propofol: 1.2 mL/hr (32 kcal) - Physical assessment shows mild fat wasting and mild and moderate muscle wasting to upper body. - Per chart review, pt has lost 7 lbs (7% body weight) in the past 6 months which is not significant for time frame.   Diet Order:  Diet NPO time specified  Skin:  Reviewed, no issues  Last BM:  6/26  Height:   Ht Readings from Last 1 Encounters:  11/10/15 4' 11"  (1.499 m)    Weight:   Wt Readings from Last 1 Encounters:  11/11/15 103 lb 9.9 oz (47 kg)    Ideal Body Weight:  42.91 kg (kg)  BMI:  Body mass index is 20.92 kg/(m^2).  Estimated Nutritional Needs:   Kcal:  1467  Protein:  56-71 grams (1.2-1.5 grams/kg)  Fluid:  >/= 1.5 L/day  EDUCATION NEEDS:   No education needs identified at this time     Jarome Matin, MS, RD, LDN Inpatient Clinical Dietitian Pager # [phone] After hours/weekend pager # [phone]

## 2015-11-11 NOTE — Progress Notes (Signed)
eLink Physician-Brief Progress Note Patient Name: Eugene Gaviaancy A Dry DOB: [birth year] MRN: [id number]   Date of Service  11/11/2015  HPI/Events of Note  Patient has had 4 loose stools and is also febrile.   eICU Interventions  1. Enteric precautions 2. Stool C. difficile with reflex PCR      Intervention Category Major Interventions: Infection - evaluation and management;Other:  Lawanda CousinsJennings Dayanne Yiu 11/11/2015, 1:36 AM

## 2015-11-11 NOTE — Progress Notes (Signed)
PULMONARY / CRITICAL CARE MEDICINE   Name: Ann Yang MRN: [id number] DOB: 29-Sep-1975    ADMISSION DATE:  11/07/2015 CONSULTATION DATE:  11/07/2015  REFERRING MD:  Dr. Cena BentonVega  CHIEF COMPLAINT:  Short of breath  40 yo female with aspiration pneumonia, acute hypoxic respiratory failure after choking on hot dog.  PMH - Down syndrome /mental retardation and seizure disorder anxiety and depression, resident of assisted living facility  SUBJECTIVE:  Improving PEEP and Fio2. Still on 2 pressors.   VITAL SIGNS: BP 103/57 mmHg  Pulse 101  Temp(Src) 99.4 F (37.4 C) (Axillary)  Resp 18  Ht 4\' 11"  (1.499 m)  Wt 103 lb 9.9 oz (47 kg)  BMI 20.92 kg/m2  SpO2 96%  HEMODYNAMICS:    VENTILATOR SETTINGS: Vent Mode:  [-] PRVC FiO2 (%):  [40 %] 40 % Set Rate:  [18 bmp] 18 bmp Vt Set:  [350 mL] 350 mL PEEP:  [6 cmH20-10 cmH20] 6 cmH20 Plateau Pressure:  [14 cmH20-23 cmH20] 16 cmH20  INTAKE / OUTPUT: I/O last 3 completed shifts: In: 7445.1 [I.V.:5145.1; NG/GT:2050; IV Piggyback:250] Out: 681 [Urine:680; Stool:1]  PHYSICAL EXAMINATION: General:  Intubated ,sedated Neuro:  No focal deficits HEENT:  No JVD Cardiovascular:  Regular, no MRG Lungs: Clear, No wheeze or crackles Abdomen:  Soft, non tender Musculoskeletal:  No edema Skin:  No rashes  LABS:  BMET  Recent Labs Lab 11/09/15 0730 11/10/15 0430 11/11/15 0124  NA 135 134* 135  K 3.8 4.3 3.5  CL 104 101 101  CO2 27 30 30   BUN 11 11 8   CREATININE 0.92 0.71 0.67  GLUCOSE 136* 205* 166*    Electrolytes  Recent Labs Lab 11/08/15 1705 11/09/15 0730 11/09/15 1700 11/10/15 0430 11/11/15 0124  CALCIUM  --  7.3*  --  7.1* 6.8*  MG 1.6* 2.3 2.0  --   --   PHOS 1.7* 2.3* 1.9*  --   --     CBC  Recent Labs Lab 11/08/15 0940 11/09/15 0730 11/10/15 0430  WBC 4.0 2.9* 1.9*  HGB 12.4 10.3* 10.2*  HCT 35.8* 30.5* 30.6*  PLT 88* 104* 82*    Coag's No results for input(s): APTT, INR in the last 168  hours.  Sepsis Markers  Recent Labs Lab 11/07/15 1409  LATICACIDVEN 3.75*    ABG  Recent Labs Lab 11/08/15 1230 11/10/15 0434  PHART 7.328* 7.280*  PCO2ART 47.9* 61.7*  PO2ART 86.1 143*    Liver Enzymes  Recent Labs Lab 11/07/15 1400  AST 31  ALT 19  ALKPHOS 54  BILITOT 1.2  ALBUMIN 4.1    Cardiac Enzymes No results for input(s): TROPONINI, PROBNP in the last 168 hours.  Glucose  Recent Labs Lab 11/10/15 1245 11/10/15 1531 11/10/15 2035 11/11/15 0058 11/11/15 0438 11/11/15 0744  GLUCAP 131* 162* 172* 147* 136* 153*    Imaging Dg Chest Port 1 View  11/11/2015  CLINICAL DATA:  Hypoxia. EXAM: PORTABLE CHEST 1 VIEW COMPARISON:  11/10/2015.  11/09/2015. FINDINGS: Endotracheal tube 3.6 cm above the carina. Left IJ line and NG tube in stable position. Cardiomegaly with persistent bilateral pulmonary infiltrates. Bilateral small pleural effusions. No pneumothorax. IMPRESSION: 1.  Lines and tubes in stable position. 2. Cardiomegaly with persistent bilateral pulmonary infiltrates/pulmonary edema and bilateral small pleural effusions . Electronically Signed   By: Maisie Fushomas  Register   On: 11/11/2015 07:16    STUDIES:   CULTURES: 6/22 Blood >>ng 6/23 resp >> 6/26 C diff > Neg  ANTIBIOTICS: Rocephin 6/22 >  6/23  Zithromax 6/22 >> 6/23  Zosyn 6/23 >>  SIGNIFICANT EVENTS: 6/22 Admit 6/23 intubated - would not keep O2 mask on  LINES/TUBES: 6/23 ETT >> 6/23 lIJ >>  DISCUSSION: ARDS, septic shock with breakthrough agitation causing vent asynchrony  ASSESSMENT / PLAN:  PULMONARY A: Acute hypoxic respiratory failure/ ARDS  2nd to aspiration pneumonia. P:   Titrating PEEP and FiO2 Start wake up and breath assessments  CARDIOVASCULAR A: Septic shock  P: On Levo and vaso Continue IV fluids  RENAL A:   AKI -resolved. Still with low UO Hypophos P:   Monitor renal fx, urine outpt  GASTROINTESTINAL A:   Dysphagia. P:   Start tube  feeds  HEMATOLOGIC A:   Thrombocytopenia -123k on admit. Leukopenia - of infection P:  F/u CBC SQ heparin for DVT prophylaxis - stop if < 60k  INFECTIOUS A:   Aspiration pneumonia. P:   Continue zosyn  ENDOCRINE A:   Hx of hypothyroidism. P:   Continue synthroid  NEUROLOGIC A:   Hx of Down syndrome, mental retardation, anxiety, depression. Hx of Seizures. P:   Continue xanax, aricept, lamictal, risperdal, zoloft  Critical care time- 35 mins.  Chilton GreathousePraveen Trenell Moxey MD [facility] Pager [phone] If no answer or after 3pm call: [phone] 11/11/2015, 9:16 AM

## 2015-11-11 NOTE — Progress Notes (Signed)
Pharmacy Antibiotic Note  Ann Yang is a 40 y.o. female admitted on 11/07/2015 with aspiration pneumonia.  Pharmacy has been following for Zosyn dosing.  Today is day #4 of Zosyn.    Plan: Zosyn 3.375g IV q8h (4 hour infusion).  Height: 4\' 11"  (149.9 cm) Weight: 103 lb 9.9 oz (47 kg) IBW/kg (Calculated) : 43.2  Temp (24hrs), Avg:99.6 F (37.6 C), Min:97.9 F (36.6 C), Max:101.9 F (38.8 C)   Recent Labs Lab 11/07/15 1400 11/07/15 1409 11/08/15 0940 11/09/15 0730 11/10/15 0430 11/11/15 0124  WBC 6.1  --  4.0 2.9* 1.9*  --   CREATININE 1.55*  --  1.07* 0.92 0.71 0.67  LATICACIDVEN  --  3.75*  --   --   --   --     Estimated Creatinine Clearance: 63.8 mL/min (by C-G formula based on Cr of 0.67).    No Known Allergies  Antimicrobials this admission: 6/22 Azith x 1 6/22 CTX x 1 6/23 Zosyn  >>   Dose adjustments this admission: -  Microbiology results:  6/22 BCx: ngtd 6/22 MRSA PCR: neg 6/26 Cdiff neg  Thank you for allowing pharmacy to be a part of this patient's care.  Clance BollRunyon, Dinia Joynt 11/11/2015 10:49 AM

## 2015-11-11 NOTE — Progress Notes (Signed)
eLink Physician-Brief Progress Note Patient Name: Eugene Gaviaancy A Pick DOB: [birth year] MRN: [id number]   Date of Service  11/11/2015  HPI/Events of Note    eICU Interventions  Restraints renewed     Intervention Category Minor Interventions: Agitation / anxiety - evaluation and management  Shoshana Johal S. 11/11/2015, 8:44 PM

## 2015-11-11 NOTE — Progress Notes (Signed)
eLink Physician-Brief Progress Note Patient Name: Ann Yang DOB: 11-27-1975 MRN: [id number]   Date of Service  11/11/2015  HPI/Events of Note  Notified by nurse of fever. Last cultures 6/22. Patient remains intubated with central venous access and Foley urine catheter. Currently on broad-spectrum antibiotics with Zosyn.   eICU Interventions  1. Checking stat blood, urine, and tracheal aspirate cultures. 2. Checking stat urinalysis with reflex 3. Tylenol via tube when necessary fever      Intervention Category Major Interventions: Infection - evaluation and management  [name] 11/11/2015, 1:14 AM

## 2015-11-12 ENCOUNTER — Inpatient Hospital Stay (HOSPITAL_COMMUNITY): Payer: Medicare Other

## 2015-11-12 LAB — CBC
HEMATOCRIT: 26.3 % — AB (ref 36.0–46.0)
Hemoglobin: 8.8 g/dL — ABNORMAL LOW (ref 12.0–15.0)
MCH: 31.9 pg (ref 26.0–34.0)
MCHC: 33.5 g/dL (ref 30.0–36.0)
MCV: 95.3 fL (ref 78.0–100.0)
PLATELETS: 91 10*3/uL — AB (ref 150–400)
RBC: 2.76 MIL/uL — ABNORMAL LOW (ref 3.87–5.11)
RDW: 13.5 % (ref 11.5–15.5)
WBC: 1.9 10*3/uL — AB (ref 4.0–10.5)

## 2015-11-12 LAB — BASIC METABOLIC PANEL
ANION GAP: 6 (ref 5–15)
BUN: 7 mg/dL (ref 6–20)
CALCIUM: 6.8 mg/dL — AB (ref 8.9–10.3)
CO2: 30 mmol/L (ref 22–32)
CREATININE: 0.62 mg/dL (ref 0.44–1.00)
Chloride: 96 mmol/L — ABNORMAL LOW (ref 101–111)
GFR calc non Af Amer: >60 mL/min (ref >60)
GLUCOSE: 160 mg/dL — AB (ref 65–99)
Potassium: 3.2 mmol/L — ABNORMAL LOW (ref 3.5–5.1)
Sodium: 132 mmol/L — ABNORMAL LOW (ref 135–145)

## 2015-11-12 LAB — URINE CULTURE
Culture: NO GROWTH
SPECIAL REQUESTS: NORMAL

## 2015-11-12 LAB — GLUCOSE, CAPILLARY
GLUCOSE-CAPILLARY: 122 mg/dL — AB (ref 65–99)
GLUCOSE-CAPILLARY: 138 mg/dL — AB (ref 65–99)
GLUCOSE-CAPILLARY: 141 mg/dL — AB (ref 65–99)
Glucose-Capillary: 161 mg/dL — ABNORMAL HIGH (ref 65–99)
Glucose-Capillary: 152 mg/dL — ABNORMAL HIGH (ref 65–99)
Glucose-Capillary: 137 mg/dL — ABNORMAL HIGH (ref 65–99)

## 2015-11-12 LAB — CULTURE, BLOOD (ROUTINE X 2)
CULTURE: NO GROWTH
CULTURE: NO GROWTH

## 2015-11-12 LAB — PHOSPHORUS: Phosphorus: 1.4 mg/dL — ABNORMAL LOW (ref 2.5–4.6)

## 2015-11-12 LAB — MAGNESIUM: MAGNESIUM: 1.9 mg/dL (ref 1.7–2.4)

## 2015-11-12 MED ORDER — SODIUM PHOSPHATES 45 MMOLE/15ML IV SOLN
30.0000 mmol | Freq: Once | INTRAVENOUS | Status: AC
  Administered 2015-11-12: 30 mmol via INTRAVENOUS
  Filled 2015-11-12: qty 10

## 2015-11-12 MED ORDER — FUROSEMIDE 10 MG/ML IJ SOLN
20.0000 mg | Freq: Once | INTRAMUSCULAR | Status: AC
  Administered 2015-11-12: 20 mg via INTRAVENOUS
  Filled 2015-11-12: qty 2

## 2015-11-12 MED ORDER — POTASSIUM CHLORIDE 20 MEQ/15ML (10%) PO SOLN
30.0000 meq | ORAL | Status: AC
  Administered 2015-11-12 (×2): 30 meq
  Filled 2015-11-12 (×2): qty 30

## 2015-11-12 NOTE — Progress Notes (Addendum)
Nutrition Follow-up  DOCUMENTATION CODES:   Non-severe (moderate) malnutrition in context of chronic illness, Underweight  INTERVENTION:  - Continue Jevity 1.2 @ 45 mL/hr. - Free water flush per MD order given electrolyte imbalance + current IVF.  - Continue PEPuP protocol.  - RD will follow-up 6/28.  NUTRITION DIAGNOSIS:   Inadequate oral intake related to inability to eat as evidenced by NPO status. -ongoing  GOAL:   Patient will meet greater than or equal to 90% of their needs -met with current TF regimen.  MONITOR:   Vent status, TF tolerance, Weight trends, Labs, I & O's  ASSESSMENT:   40 y.o. female with medical history significant of MR, seizure disorder, anxiety, depression. Presenting to the hospital after aspiration event requiring Heimlich maneuver. Patient was evaluated and found to be hypoxic and subsequently presented to the hospital for further evaluation. Unable to obtain history secondary to advanced MR  6/27 Pt with OGT in place and currently receiving goal rate TF: Jevity 1.2 @ 45 mL/hr with 200 mL free water every 6 hours. This regimen is providing 1296 kcal, 60 grams of protein, and 1272 mL free water. Nutrition needs updated based on current Ve and Tmax; weight of 46.3 kcal from 6/25 used to calculate needs. Weight up 15 lbs since that time. Free water flush per MD order given current IVF + electrolyte abnormalities. No current order for free water flush.   Patient is currently intubated on ventilator support MV: 6.7 L/min Temp (24hrs), Avg:99.3 F (37.4 C), Min:98.2 F (36.8 C), Max:100 F (37.8 C) Propofol: 1.2 ml/hr (32 kcal)  RN reports that wakeup assessment in progress at this time. Current TF regimen + kcal from Propofol is providing 1328 kcal. RD will follow-up tomorrow. Medications reviewed; sliding scale Novolog, 40 mg Protonix/day, 3.375 g Zosyn every 8 hours, 30 mEq KCl every 4 hours per tube x2 doses today, 30 mmol IV NaPhos x1 dose today.  Labs reviewed; CBGs: 152 and 161 mg/dL this AM, Na: 132 mmol/L, K: 3.2 mmol/L, Cl: 96 mmol/L, Ca: 6.8 mg/dL, Phos: 1.4 mg/dL.  IVF: NS @ 75 mL/hr. Drips: Fentanyl @ 25 mcg/hr, Propofol @ 5 mcg/kg/min, Levo @ 11 mcg/min.   6/26 - Pt remains with OGT in place and is currently receiving TF at goal rate: Jevity 1.2 @ 45 mL/hr which is providing 1296 kcal (88% estimated kcal needs), 60 grams of protein, and 872 mL free water. - Nutrition needs updated this AM based on current Tmax and Ve.  - Current weight up 12 lbs since weight recorded at admission. Current weight consistent with weight from yesterday. - Patient is currently intubated on ventilator support; MV: 6.2 L/min; Temp (24hrs),  Max:101.9 F (38.8 C); Propofol: 2.5 ml/hr (66 kcal). - Per MD note this AM, plan to start wake up and breath assessments.  - Spoke with RN in the room who reports inability to wean from vent at this time d/t ongoing need for pressor support.   IVF: NS @ 75 mL/hr.  Drips: Fentanyl @ 50 mcg/hr, Propofol @ 10 mcg/kg/min, Levo @ 13 mcg/min, Vaso @ 0.03 units/min.   6/23 - No family/visitors present at this time.  - Per notes, pt admitted following aspiration after eating a hot dog.  - Pt intubated today ~1200. - Patient is currently intubated on ventilator support with OGT in place.; MV: 8.4 L/min; Max:101.2 F (38.4 C); Propofol: 1.2 mL/hr (32 kcal) - Physical assessment shows mild fat wasting and mild and moderate muscle wasting to  upper body. - Per chart review, pt has lost 7 lbs (7% body weight) in the past 6 months which is not significant for time frame.    Diet Order:  Diet NPO time specified  Skin:  Reviewed, no issues (MSAD to bilateral perineum and buttocks)  Last BM:  6/27  Height:   Ht Readings from Last 1 Encounters:  11/10/15 4' 11"  (1.499 m)    Weight:   Wt Readings from Last 1 Encounters:  11/12/15 117 lb 1 oz (53.1 kg)    Ideal Body Weight:  42.91 kg (kg)  BMI:  Body mass  index is 23.63 kg/(m^2).  Estimated Nutritional Needs:   Kcal:  1308  Protein:  56-69 grams (1.2-1.5 grams/kg)  Fluid:  1.5 L/day  EDUCATION NEEDS:   No education needs identified at this time     Jarome Matin, MS, RD, LDN Inpatient Clinical Dietitian Pager # [phone] After hours/weekend pager # [phone]

## 2015-11-12 NOTE — Progress Notes (Signed)
PULMONARY / CRITICAL CARE MEDICINE   Name: Ann Yang MRN: [id number] DOB: [birth year]    ADMISSION DATE:  11/07/2015 CONSULTATION DATE:  11/07/2015  REFERRING MD:  Dr. Cena BentonVega  CHIEF COMPLAINT:  Short of breath  40 yo female with aspiration pneumonia, acute hypoxic respiratory failure after choking on hot dog. PMH - Down syndrome /mental retardation and seizure disorder anxiety and depression, resident of assisted living facility  SUBJECTIVE:  Improving PEEP and Fio2. Coming off pressors  VITAL SIGNS: BP 114/57 mmHg  Pulse 97  Temp(Src) 98.2 F (36.8 C) (Axillary)  Resp 18  Ht 4\' 11"  (1.499 m)  Wt 117 lb 1 oz (53.1 kg)  BMI 23.63 kg/m2  SpO2 96%  HEMODYNAMICS:    VENTILATOR SETTINGS: Vent Mode:  [-] PSV;CPAP FiO2 (%):  [40 %] 40 % Set Rate:  [18 bmp] 18 bmp Vt Set:  [350 mL] 350 mL PEEP:  [5 cmH20] 5 cmH20 Pressure Support:  [10 cmH20] 10 cmH20 Plateau Pressure:  [14 cmH20-18 cmH20] 18 cmH20  INTAKE / OUTPUT: I/O last 3 completed shifts: In: 7083.5 [I.V.:4763.5; NG/GT:2070; IV Piggyback:250] Out: 1058 [Urine:1055; Stool:3]  PHYSICAL EXAMINATION: General:  Intubated ,sedated Neuro:  No focal deficits HEENT:  No JVD Cardiovascular:  Regular, no MRG Lungs: Clear, No wheeze or crackles Abdomen:  Soft, non tender Musculoskeletal:  No edema Skin:  No rashes  LABS:  BMET  Recent Labs Lab 11/10/15 0430 11/11/15 0124 11/12/15 0508  NA 134* 135 132*  K 4.3 3.5 3.2*  CL 101 101 96*  CO2 30 30 30   BUN 11 8 7   CREATININE 0.71 0.67 0.62  GLUCOSE 205* 166* 160*    Electrolytes  Recent Labs Lab 11/09/15 0730 11/09/15 1700 11/10/15 0430 11/11/15 0124 11/12/15 0508  CALCIUM 7.3*  --  7.1* 6.8* 6.8*  MG 2.3 2.0  --   --  1.9  PHOS 2.3* 1.9*  --   --  1.4*    CBC  Recent Labs Lab 11/10/15 0430 11/11/15 1020 11/12/15 0508  WBC 1.9* 1.1* 1.9*  HGB 10.2* 9.2* 8.8*  HCT 30.6* 28.0* 26.3*  PLT 82* 85* 91*    Coag's No results for input(s):  APTT, INR in the last 168 hours.  Sepsis Markers  Recent Labs Lab 11/07/15 1409  LATICACIDVEN 3.75*    ABG  Recent Labs Lab 11/08/15 1230 11/10/15 0434  PHART 7.328* 7.280*  PCO2ART 47.9* 61.7*  PO2ART 86.1 143*    Liver Enzymes  Recent Labs Lab 11/07/15 1400  AST 31  ALT 19  ALKPHOS 54  BILITOT 1.2  ALBUMIN 4.1    Cardiac Enzymes No results for input(s): TROPONINI, PROBNP in the last 168 hours.  Glucose  Recent Labs Lab 11/11/15 1208 11/11/15 1502 11/11/15 1952 11/11/15 2348 11/12/15 0347 11/12/15 0739  GLUCAP 141* 137* 125* 141* 161* 152*    Imaging Dg Chest Port 1 View  11/12/2015  CLINICAL DATA:  Intubation. EXAM: PORTABLE CHEST 1 VIEW COMPARISON:  11/11/2015. FINDINGS: Endotracheal tube, NG tube, left IJ line stable position. Cardiomegaly with diffuse bilateral pulmonary infiltrates and pleural effusions consistent congestive heart failure. Slight worsening from prior exams. No pneumothorax. IMPRESSION: 1.  Lines and tubes in stable position. 2. Cardiomegaly diffuse bilateral pulmonary infiltrates and bilateral pleural effusions consistent congestive heart failure. Slight worsening from prior exam . Electronically Signed   By: Maisie Fushomas  Register   On: 11/12/2015 07:13    STUDIES:   CULTURES: 6/22 Blood >>ng 6/23 resp >> 6/26 C diff >  Neg  ANTIBIOTICS: Rocephin 6/22 > 6/23  Zithromax 6/22 >> 6/23  Zosyn 6/23 >>  SIGNIFICANT EVENTS: 6/22 Admit 6/23 intubated - would not keep O2 mask on  LINES/TUBES: 6/23 ETT >> 6/23 lIJ >>  DISCUSSION: ARDS, septic shock with breakthrough agitation causing vent asynchrony  ASSESSMENT / PLAN:  PULMONARY A: Acute hypoxic respiratory failure/ ARDS  2nd to aspiration pneumonia. P:   Titrating PEEP and FiO2 Start wake up and breath assessments  CARDIOVASCULAR A: Septic shock  P: On Levo. Off vaso D/C IV fluids Try gentle diuresis  RENAL A:   AKI -resolved. Still with low UO Hypophos P:    Monitor renal fx, urine outpt  GASTROINTESTINAL A:   Dysphagia. P:   Continue tube feeds  HEMATOLOGIC A:   Thrombocytopenia - Improving Leukopenia - Improving P:  F/u CBC SQ heparin for DVT prophylaxis.  INFECTIOUS A:   Aspiration pneumonia. P:   Continue zosyn  ENDOCRINE A:   Hx of hypothyroidism. P:   Continue synthroid  NEUROLOGIC A:   Hx of Down syndrome, mental retardation, anxiety, depression. Hx of Seizures. P:   Continue xanax, aricept, lamictal, risperdal, zoloft  Critical care time- 35 mins.  Chilton GreathousePraveen Danecia Underdown MD [facility] Pager [phone] If no answer or after 3pm call: [phone] 11/12/2015, 9:39 AM

## 2015-11-12 NOTE — Progress Notes (Signed)
[facility] LAB REPLACEMENT ONLY  The [facility] does [facility] based on the criteria listed below:   1. Is GFR >/= 40 ml/min? Yes.    Patient's GFR today is >60 2. Is urine output >/= 0.5 ml/kg/hr for the last 6 hours? Yes.   Patient's UOP is 0.75 ml/kg/hr 3. Is BUN < 60 mg/dL? Yes.    Patient's BUN today is 7 4. Abnormal electrolyte  K 3.2 5. Ordered repletion with: per protocol 6. If a panic level lab has been reported, has the CCM MD in charge been notified? Yes.  .   Physician:  Kristine LineaSommer  Shulem Mader McEachran 11/12/2015 6:53 AM

## 2015-11-12 NOTE — Clinical Social Work Note (Signed)
Clinical Social Work Assessment  Patient Details  Name: Ann Yang MRN: [id number] Date of Birth: [birth year]  Date of referral:  11/12/15               Reason for consult:  Discharge Planning                Permission sought to share information with:    Permission granted to share information::     Name::        Agency::     Relationship::     Contact Information:     Housing/Transportation Living arrangements for the past 2 months:   (Pt is from " [facility] " home.) Source of Information:  Other (Comment Required) Ann Yang : ALF) Patient Interpreter Needed:  None Criminal Activity/Legal Involvement Pertinent to Current Situation/Hospitalization:  No - Comment as needed Significant Relationships:  Parents Lives with:  Other (Comment) Ann Yang) Do you feel safe going back to the place where you live?   (Unable to determine) Need for family participation in patient care:  Yes (Comment)  Care giving concerns:  [facility] ( ALF ) requesting info / education to help prevent pt from choking again.   Social Worker assessment / plan:  Pt with MR / DD hospitalized on 11/07/15 with aspiration pneumonia and acute respiratory failure. Pt is from [facility] home operated by Ann Yang [phone]( [phone] ). PN reviewed. No family / staff in room. Pt is on vent. Ann Yang contacted to assist with d/c planning. Pt will return to ALF provided a higher level of care is not needed at d/c. CSW will continue to follow to assist with d/c planning needs.  Employment status:  Disabled (Comment on whether or not currently receiving Disability) Insurance information:  Medic[place], [place] PT Recommendations:  Not assessed at this time Information / Referral to community resources:     Patient/Family's Response to care:  Pt will return to ALF provided a higher level of care isn't needed at d/c.  Patient/Family's Understanding of and Emotional Response  to Diagnosis, Current Treatment, and Prognosis:  No family / staff at bedside. Unsure, at this time, if family / ALF  have been medically updated by MD. Ann Yang ( ALF ) would appreciate info / education to try to help prevent pt from choking on food in the future.  Emotional Assessment Appearance:  Appears stated age Attitude/Demeanor/Rapport:  Unable to Assess Affect (typically observed):  Unable to Assess Orientation:   (Pt on vent. Unable to determine.) Alcohol / Substance use:  Not Applicable Psych involvement (Current and /or in the community):  No (Comment)  Discharge Needs  Concerns to be addressed:  Discharge Planning Concerns Readmission within the last 30 days:  No Current discharge risk:  None Barriers to Discharge:  No Barriers Identified   Ann Yang, Ann Holaway Yang, Ann Yang  [phone] 11/12/2015, 2:51 PM

## 2015-11-13 ENCOUNTER — Inpatient Hospital Stay (HOSPITAL_COMMUNITY): Payer: Medicare Other

## 2015-11-13 DIAGNOSIS — Q211 Atrial septal defect: Secondary | ICD-10-CM

## 2015-11-13 DIAGNOSIS — R7989 Other specified abnormal findings of blood chemistry: Secondary | ICD-10-CM

## 2015-11-13 LAB — ECHOCARDIOGRAM COMPLETE
AOVTI: 58.5 cm
AV Mean grad: 26 mmHg
AV Peak grad: 56 mmHg
AVPKVEL: 373 cm/s
Area-P 1/2: 5.95 cm2
CHL CUP MV DEC (S): 130
CHL CUP MV M VEL: 142
DOP CAL AO MEAN VELOCITY: 219 cm/s
E decel time: 130 msec
FS: 40 % (ref 28–44)
HEIGHTINCHES: 59 in
IV/PV OW: 1.17
LA ID, A-P, ES: 35 mm
LA diam end sys: 35 mm
LA vol index: 32.9 mL/m2
LA vol: 47.1 mL
LADIAMINDEX: 2.45 cm/m2
LAVOLA4C: 37.2 mL
LDCA: 2.01 cm2
LV PW d: 8.27 mm — AB (ref 0.6–1.1)
LVOT diameter: 16 mm
MV Annulus VTI: 48.7 cm
MV Peak grad: 12 mmHg
MV pk A vel: 170 m/s
MV pk E vel: 173 m/s
Mean grad: 10 mmHg
P 1/2 time: 37 ms
Reg peak vel: 434 cm/s
TR max vel: 434 cm/s
WEIGHTICAEL: 1749.57 [oz_av]

## 2015-11-13 LAB — CBC
HEMATOCRIT: 28.5 % — AB (ref 36.0–46.0)
HEMOGLOBIN: 9.2 g/dL — AB (ref 12.0–15.0)
MCH: 32.3 pg (ref 26.0–34.0)
MCHC: 32.3 g/dL (ref 30.0–36.0)
MCV: 100 fL (ref 78.0–100.0)
Platelets: 123 10*3/uL — ABNORMAL LOW (ref 150–400)
RBC: 2.85 MIL/uL — ABNORMAL LOW (ref 3.87–5.11)
RDW: 14.9 % (ref 11.5–15.5)
WBC: 4.4 10*3/uL (ref 4.0–10.5)

## 2015-11-13 LAB — TROPONIN I
Troponin I: 0.08 ng/mL (ref <0.03)
Troponin I: 0.08 ng/mL (ref <0.03)
Troponin I: 0.08 ng/mL (ref <0.03)

## 2015-11-13 LAB — BASIC METABOLIC PANEL
ANION GAP: 3 — AB (ref 5–15)
BUN: 7 mg/dL (ref 6–20)
CHLORIDE: 100 mmol/L — AB (ref 101–111)
CO2: 41 mmol/L — ABNORMAL HIGH (ref 22–32)
Calcium: 7.5 mg/dL — ABNORMAL LOW (ref 8.9–10.3)
Creatinine, Ser: 0.83 mg/dL (ref 0.44–1.00)
GFR calc Af Amer: >60 mL/min (ref >60)
GFR calc non Af Amer: >60 mL/min (ref >60)
GLUCOSE: 164 mg/dL — AB (ref 65–99)
POTASSIUM: 3.3 mmol/L — AB (ref 3.5–5.1)
SODIUM: 144 mmol/L (ref 135–145)

## 2015-11-13 LAB — GLUCOSE, CAPILLARY
GLUCOSE-CAPILLARY: 157 mg/dL — AB (ref 65–99)
GLUCOSE-CAPILLARY: 173 mg/dL — AB (ref 65–99)
GLUCOSE-CAPILLARY: 174 mg/dL — AB (ref 65–99)
GLUCOSE-CAPILLARY: 138 mg/dL — AB (ref 65–99)
Glucose-Capillary: 135 mg/dL — ABNORMAL HIGH (ref 65–99)
Glucose-Capillary: 151 mg/dL — ABNORMAL HIGH (ref 65–99)

## 2015-11-13 LAB — PHOSPHORUS: PHOSPHORUS: 1.9 mg/dL — AB (ref 2.5–4.6)

## 2015-11-13 LAB — CORTISOL: CORTISOL PLASMA: 17.4 ug/dL

## 2015-11-13 LAB — MAGNESIUM: MAGNESIUM: 2.1 mg/dL (ref 1.7–2.4)

## 2015-11-13 MED ORDER — JEVITY 1.2 CAL PO LIQD
1000.0000 mL | ORAL | Status: DC
  Administered 2015-11-14 – 2015-11-15 (×2): 1000 mL

## 2015-11-13 MED ORDER — SODIUM CHLORIDE 0.9 % IV BOLUS (SEPSIS)
250.0000 mL | Freq: Once | INTRAVENOUS | Status: AC
  Administered 2015-11-13: 250 mL via INTRAVENOUS

## 2015-11-13 NOTE — Progress Notes (Signed)
[facility] made aware of positive troponin of 0.08.  Will continue to monitor.

## 2015-11-13 NOTE — Progress Notes (Signed)
MD made aware of positive troponin level of 0.08 at 1220 on 11/13/2015  Will continue to monitor

## 2015-11-13 NOTE — Progress Notes (Signed)
  Subjective: Patient examined, echocardiogram personally reviewed 40 year old diminutive female with Down syndrome admitted with aspiration pneumonia and developed septic shock requiring intubation, IV vasopressin and currently low-dose norepinephrine. Temperature 101.7, severe leukopenia on presentation. Chest x-ray shows severe airspace disease right greater than left. Patient is currently sedated on ventilator. The patient has been institutionalized because her mother is no longer able to care for her due to mother's health problems. 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 Past medical history is positive for seizures and history of cardioversion Objective: Vital signs in last 24 hours: Temp:  [100 F (37.8 C)-101.6 F (38.7 C)] 101.5 F (38.6 C) (06/28 1909) Pulse Rate:  [85-144] 92 (06/28 2045) Cardiac Rhythm:  [-] Sinus tachycardia (06/28 1900) Resp:  [13-29] 18 (06/28 2045) BP: (82-151)/(31-79) 91/40 mmHg (06/28 2045) SpO2:  [90 %-96 %] 95 % (06/28 2045) FiO2 (%):  [40 %] 40 % (06/28 1502) Weight:  [109 lb 5.6 oz (49.6 kg)] 109 lb 5.6 oz (49.6 kg) (06/28 0249)  Hemodynamic parameters for last 24 hours: CVP:  [10 mmHg] 10 mmHg  Intake/Output from previous day: 06/27 0701 - 06/28 0700 In: 2809.2 [I.V.:1159.2; NG/GT:1290; IV Piggyback:360] Out: 5450 [Urine:5450] Intake/Output this shift: Total I/O In: 6.6 [I.V.:6.6] Out: 525 [Urine:525]  Diminutive Caucasian female unresponsive on ventilator 4/6 systolic murmur 2/6 diastolic murmur Coarse breath sounds bilaterally Peripheral pulses are present consistent with sepsis Minimal edema  Lab Results:  Recent Labs  11/12/15 0508 11/13/15 0436  WBC 1.9* 4.4  HGB 8.8* 9.2*  HCT 26.3* 28.5*  PLT 91* 123*    BMET:  Recent Labs  11/12/15 0508 11/13/15 0514  NA 132* 144  K 3.2* 3.3*  CL 96* 100*  CO2 30 41*  GLUCOSE 160* 164*  BUN 7 7  CREATININE 0.62 0.83  CALCIUM 6.8* 7.5*    PT/INR: No results for input(s): LABPROT, INR in the last 72 hours. ABG    Component Value Date/Time   PHART 7.280* 11/10/2015 0434   HCO3 28.1* 11/10/2015 0434   TCO2 26.4 11/10/2015 0434   ACIDBASEDEF 1.2 11/08/2015 1230   O2SAT 99.0 11/10/2015 0434   CBG (last 3)   Recent Labs  11/13/15 0733 11/13/15 1136 11/13/15 1532  GLUCAP 151* 173* 174*    Assessment/Plan: S/P  aspiration pneumonia with sepsis and a 40 year old female with severe congenital heart disease-AV canal defect.  The patient is currently not a candidate for cardiac surgical repair. The patient's social situation is somewhat complicated-she does not live with her mother but has a caretaker in a long-term facility. It is not clear whether she has been previously diagnosed with congenital heart disease.  If the patient survives her current hospitalization the best plan would be for referral to an  adult congenital heart disease [facility] a [facility] for appropriate cardiology care and possible surgical evaluation.     LOS: 6 days    Kathlee Nationseter Van Trigt III 11/13/2015

## 2015-11-13 NOTE — Progress Notes (Signed)
Chaplain referred to family via Geographical information systems officernursing secretary.  Reports MD conference with family, pt's mother visibly distressed.    Pt's mother not on unit when chaplain arrived.  Discussed case with RN.  Understand mother is pt's guardian and mother's decision to move toward DNR is difficult for pt's caretaker.   Will follow up for assessment and support around emotional and spiritual issues.    Ann Yang, Ann Yang MDiv

## 2015-11-13 NOTE — Progress Notes (Signed)
eLink Physician-Brief Progress Note Patient Name: Ann Yang DOB: 09/21/1975 MRN: [id number]   Date of Service  11/13/2015  HPI/Events of Note  Asked to review EKG which reveals possible anterior ischemia. Patient is not symptomatic. EKG is very similar to prior EKG from 11/07/2015 except T wave upright in V3 on prior EKG. Finding may be d/t lead placement.   eICU Interventions  Will order: 1. Cycle Troponin.      Intervention Category Intermediate Interventions: Diagnostic test evaluation  Ann Yang,Ann Yang 11/13/2015, 5:52 AM

## 2015-11-13 NOTE — Progress Notes (Signed)
Echocardiogram 2D Echocardiogram has been performed.  Ann Yang, Ann Yang 11/13/2015, 3:17 PM

## 2015-11-13 NOTE — Progress Notes (Signed)
HR maintaining in the 130-140 bpm range.  MD made aware.    Will continue to monitor.

## 2015-11-13 NOTE — Progress Notes (Signed)
Nutrition Follow-up  DOCUMENTATION CODES:   Non-severe (moderate) malnutrition in context of chronic illness, Underweight  INTERVENTION:  - Will change TF regimen: Jevity 1.2 to new goal of 50 mL/hr. This will provide 1440 kcal, 67 grams of protein, and 968 mL free water.  - Continue PEPuP protocol. - Free water flush order per MD given no IVF, low urine output, worsening CHF per imaging report 6/26. - RD will follow-up 6/29.  NUTRITION DIAGNOSIS:   Inadequate oral intake related to inability to eat as evidenced by NPO status. -ongoing  GOAL:   Patient will meet greater than or equal to 90% of their needs -met with current TF regimen.  MONITOR:   Vent status, TF tolerance, Weight trends, Labs, I & O's  ASSESSMENT:   40 y.o. female with medical history significant of MR, seizure disorder, anxiety, depression. Presenting to the hospital after aspiration event requiring Heimlich maneuver. Patient was evaluated and found to be hypoxic and subsequently presented to the hospital for further evaluation. Unable to obtain history secondary to advanced MR  6/28 Pt continues with OGT and Jevity 1.2 @ 45 mL/hr with 200 mL free water every 6 hours. This regimen is providing 1296 kcal, 60 grams of protein, and 1672 mL free water. Dr. Matilde Bash note from this AM states AKI resolved but pt continues with low urine output.   Patient is currently intubated on ventilator support MV: 5.4 L/min Temp (24hrs), Avg:100.1 F (37.8 C), Min:98.5 F (36.9 C), Max:101.6 F (38.7 C) Propofol: now off  Per imaging today 6/26: FINDINGS: Cardiomegaly with diffuse bilateral pulmonary infiltrates and pleural effusions consistent congestive heart failure. Slight worsening from prior exams. No pneumothorax. IMPRESSION: Cardiomegaly diffuse bilateral pulmonary infiltrates and bilateral pleural effusions consistent congestive heart failure. Slight worsening from prior exam .  Nutrition needs adjusted for current  Ve and Tmax. Will change TF regimen as outlined above and follow-up tomorrow. Dr. Matilde Bash note states pt doing well with weaning from vent this AM; will monitor for course related to this progress.   Medications reviewed; sliding scale Novolog, 50 mcg Synthroid/day, 40 mg Protonix/day. Labs reviewed; CBGs: 151 and 157 mg/dL this AM, K: 3.3 mmol/L, Cl: 100 mmol/L, Ca: 7.5 mg/dL, Phos: 1.9 mg/dL.  Drip: Vaso off since yesterday, Levo weaning and now at 17 mcg/min.     6/27 - Pt with OGT in place and currently receiving goal rate TF: Jevity 1.2 @ 45 mL/hr with 200 mL free water every 6 hours.  - This regimen is providing 1296 kcal, 60 grams of protein, and 1272 mL free water.  - Weight of 46.3 kcal from 6/25 used to calculate needs. Weight up 15 lbs since that time.  - Free water flush per MD order given current IVF + electrolyte abnormalities. No current order for free water flush.  - Patient is currently intubated on ventilator support; MV: 6.7 L/min; Temp (24hrs), Max:100 F (37.8 C); Propofol: 1.2 ml/hr (32 kcal) - RN reports that wakeup assessment in progress at this time.  - Current TF regimen + kcal from Propofol is providing 1328 kcal.   IVF: NS @ 75 mL/hr. Drips: Fentanyl @ 25 mcg/hr, Propofol @ 5 mcg/kg/min, Levo @ 11 mcg/min.   6/26 - Pt remains with OGT in place and is currently receiving TF at goal rate: Jevity 1.2 @ 45 mL/hr which is providing 1296 kcal (88% estimated kcal needs), 60 grams of protein, and 872 mL free water. - Nutrition needs updated this AM based on current Tmax  and Ve.  - Current weight up 12 lbs since weight recorded at admission. Current weight consistent with weight from yesterday. - Patient is currently intubated on ventilator support; MV: 6.2 L/min; Temp (24hrs), Max:101.9 F (38.8 C); Propofol: 2.5 ml/hr (66 kcal). - Per MD note this AM, plan to start wake up and breath assessments.  - Spoke with RN in the room who reports inability to wean from  vent at this time d/t ongoing need for pressor support.   IVF: NS @ 75 mL/hr.  Drips: Fentanyl @ 50 mcg/hr, Propofol @ 10 mcg/kg/min, Levo @ 13 mcg/min, Vaso @ 0.03 units/min.   Diet Order:  Diet NPO time specified  Skin:  Reviewed, no issues  Last BM:  6/27  Height:   Ht Readings from Last 1 Encounters:  11/10/15 _0  (1.499 m)    Weight:   Wt Readings from Last 1 Encounters:  11/13/15 109 lb 5.6 oz (49.6 kg)    Ideal Body Weight:  42.91 kg (kg)  BMI:  Body mass index is 22.07 kg/(m^2).  Estimated Nutritional Needs:   Kcal:  1418  Protein:  56-69 grams (1.2-1.5 grams/kg)  Fluid:  1.5 L/day  EDUCATION NEEDS:   No education needs identified at this time     Jarome Matin, MS, RD, LDN Inpatient Clinical Dietitian Pager # [phone] After hours/weekend pager # [phone]

## 2015-11-13 NOTE — Progress Notes (Addendum)
PULMONARY / CRITICAL CARE MEDICINE   Name: Ann Yang MRN: [id number] DOB: [birth year]    ADMISSION DATE:  11/07/2015 CONSULTATION DATE:  11/07/2015  REFERRING MD:  Dr. Cena BentonVega  CHIEF COMPLAINT:  Short of breath  40 yo female with aspiration pneumonia, acute hypoxic respiratory failure after choking on hot dog. PMH - Down syndrome /mental retardation and seizure disorder anxiety and depression, resident of assisted living facility  SUBJECTIVE:  Doing well on weaning trial but mental status continues to be poor.   VITAL SIGNS: BP 109/45 mmHg  Pulse 120  Temp(Src) 100 F (37.8 C) (Oral)  Resp 14  Ht 4\' 11"  (1.499 m)  Wt 109 lb 5.6 oz (49.6 kg)  BMI 22.07 kg/m2  SpO2 93%  HEMODYNAMICS:    VENTILATOR SETTINGS: Vent Mode:  [-] PSV;CPAP FiO2 (%):  [40 %] 40 % Set Rate:  [18 bmp] 18 bmp Vt Set:  [350 mL] 350 mL PEEP:  [5 cmH20] 5 cmH20 Pressure Support:  [10 cmH20] 10 cmH20 Plateau Pressure:  [6 cmH20-19 cmH20] 18 cmH20  INTAKE / OUTPUT: I/O last 3 completed shifts: In: 4886.1 [I.V.:2641.1; NG/GT:1785; IV Piggyback:460] Out: 6015 [Urine:6015]  PHYSICAL EXAMINATION: General:  Intubated ,sedated Neuro:  No focal deficits HEENT:  No JVD Cardiovascular:  Regular, no MRG Lungs: Clear, No wheeze or crackles Abdomen:  Soft, non tender Musculoskeletal:  No edema Skin:  No rashes  LABS:  BMET  Recent Labs Lab 11/11/15 0124 11/12/15 0508 11/13/15 0514  NA 135 132* 144  K 3.5 3.2* 3.3*  CL 101 96* 100*  CO2 30 30 41*  BUN 8 7 7   CREATININE 0.67 0.62 0.83  GLUCOSE 166* 160* 164*    Electrolytes  Recent Labs Lab 11/09/15 1700  11/11/15 0124 11/12/15 0508 11/13/15 0514  CALCIUM  --   < > 6.8* 6.8* 7.5*  MG 2.0  --   --  1.9 2.1  PHOS 1.9*  --   --  1.4* 1.9*  < > = values in this interval not displayed.  CBC  Recent Labs Lab 11/11/15 1020 11/12/15 0508 11/13/15 0436  WBC 1.1* 1.9* 4.4  HGB 9.2* 8.8* 9.2*  HCT 28.0* 26.3* 28.5*  PLT 85* 91* 123*     Coag's No results for input(s): APTT, INR in the last 168 hours.  Sepsis Markers  Recent Labs Lab 11/07/15 1409  LATICACIDVEN 3.75*    ABG  Recent Labs Lab 11/08/15 1230 11/10/15 0434  PHART 7.328* 7.280*  PCO2ART 47.9* 61.7*  PO2ART 86.1 143*    Liver Enzymes  Recent Labs Lab 11/07/15 1400  AST 31  ALT 19  ALKPHOS 54  BILITOT 1.2  ALBUMIN 4.1    Cardiac Enzymes  Recent Labs Lab 11/13/15 0628  TROPONINI 0.08*    Glucose  Recent Labs Lab 11/12/15 1157 11/12/15 1609 11/12/15 1957 11/12/15 2359 11/13/15 0341 11/13/15 0733  GLUCAP 137* 138* 122* 135* 157* 151*    Imaging Dg Chest Port 1 View  11/13/2015  CLINICAL DATA:  Acute respiratory failure EXAM: PORTABLE CHEST 1 VIEW COMPARISON:  11/12/2015 FINDINGS: Endotracheal tube in good position. Central venous catheter tip SVC unchanged. No pneumothorax Mild improvement in diffuse bibasilar airspace disease, most likely edema and atelectasis. Small pleural effusions and bibasilar atelectasis. IMPRESSION: Endotracheal tube remains in good position Mild improvement in bibasilar airspace disease, most consistent with edema and atelectasis. Electronically Signed   By: Marlan Palauharles  Clark M.D.   On: 11/13/2015 07:26    STUDIES:   CULTURES: 6/22  Blood >>ng 6/23 resp >> 6/26 C diff > Neg  ANTIBIOTICS: Rocephin 6/22 > 6/23  Zithromax 6/22 >> 6/23  Zosyn 6/23 >>  SIGNIFICANT EVENTS: 6/22 Admit 6/23 intubated - would not keep O2 mask on  LINES/TUBES: 6/23 ETT >> 6/23 lIJ >>  DISCUSSION: ARDS, septic shock with breakthrough agitation causing vent asynchrony  ASSESSMENT / PLAN:  PULMONARY A: Acute hypoxic respiratory failure/ ARDS  2nd to aspiration pneumonia. P:   Titrating PEEP and FiO2 Start wake up and breath assessments  CARDIOVASCULAR A: Septic shock  P: On Levo. Titrate off Check echo, cortisol  RENAL A:   AKI -resolved. Still with low UO Hypophos P:   Monitor renal fx,  urine outpt  GASTROINTESTINAL A:   Dysphagia. P:   Continue tube feeds  HEMATOLOGIC A:   Thrombocytopenia - Improving Leukopenia - Improving P:  F/u CBC SQ heparin for DVT prophylaxis.  INFECTIOUS A:   Aspiration pneumonia. P:   Continue zosyn  ENDOCRINE A:   Hx of hypothyroidism. P:   Continue synthroid  NEUROLOGIC A:   Hx of Down syndrome, mental retardation, anxiety, depression. Hx of Seizures. P:   Continue xanax, aricept, lamictal, risperdal, zoloft  Critical care time- 35 mins.  Chilton GreathousePraveen Erling Arrazola MD [facility] Pager [phone] If no answer or after 3pm call: [phone] 11/13/2015, 9:56 AM

## 2015-11-13 NOTE — Progress Notes (Signed)
CRITICAL VALUE ALERT  Critical value received: troponin 0.08  Date of notification:  11/13/2015  Time of notification:  0700  Critical value read back:Yes.    Nurse who received alert:  Effie BerkshireAlex Raylan Troiani  MD notified (1st page):  Mannam  Time of first page:  0710  MD notified (2nd page):  Time of second page:  Responding MD:  Isaiah SergeMannam  Time MD responded:  [phone]

## 2015-11-14 ENCOUNTER — Inpatient Hospital Stay (HOSPITAL_COMMUNITY): Payer: Medicare Other

## 2015-11-14 LAB — CBC
HEMATOCRIT: 28.7 % — AB (ref 36.0–46.0)
HEMOGLOBIN: 9.3 g/dL — AB (ref 12.0–15.0)
MCH: 32.4 pg (ref 26.0–34.0)
MCHC: 32.4 g/dL (ref 30.0–36.0)
MCV: 100 fL (ref 78.0–100.0)
Platelets: 128 10*3/uL — ABNORMAL LOW (ref 150–400)
RBC: 2.87 MIL/uL — AB (ref 3.87–5.11)
RDW: 14.3 % (ref 11.5–15.5)
WBC: 11 10*3/uL — ABNORMAL HIGH (ref 4.0–10.5)

## 2015-11-14 LAB — BASIC METABOLIC PANEL
Anion gap: 3 — ABNORMAL LOW (ref 5–15)
BUN: 10 mg/dL (ref 6–20)
CHLORIDE: 100 mmol/L — AB (ref 101–111)
CO2: 40 mmol/L — AB (ref 22–32)
CREATININE: 0.69 mg/dL (ref 0.44–1.00)
Calcium: 7.4 mg/dL — ABNORMAL LOW (ref 8.9–10.3)
GFR calc non Af Amer: >60 mL/min (ref >60)
Glucose, Bld: 149 mg/dL — ABNORMAL HIGH (ref 65–99)
POTASSIUM: 3.3 mmol/L — AB (ref 3.5–5.1)
Sodium: 143 mmol/L (ref 135–145)

## 2015-11-14 LAB — TRIGLYCERIDES: TRIGLYCERIDES: 85 mg/dL (ref <150)

## 2015-11-14 LAB — MAGNESIUM: MAGNESIUM: 2.1 mg/dL (ref 1.7–2.4)

## 2015-11-14 LAB — GLUCOSE, CAPILLARY
GLUCOSE-CAPILLARY: 157 mg/dL — AB (ref 65–99)
Glucose-Capillary: 123 mg/dL — ABNORMAL HIGH (ref 65–99)
Glucose-Capillary: 139 mg/dL — ABNORMAL HIGH (ref 65–99)
Glucose-Capillary: 139 mg/dL — ABNORMAL HIGH (ref 65–99)
Glucose-Capillary: 142 mg/dL — ABNORMAL HIGH (ref 65–99)
Glucose-Capillary: 159 mg/dL — ABNORMAL HIGH (ref 65–99)
Glucose-Capillary: 169 mg/dL — ABNORMAL HIGH (ref 65–99)

## 2015-11-14 LAB — CULTURE, RESPIRATORY

## 2015-11-14 LAB — CULTURE, RESPIRATORY W GRAM STAIN

## 2015-11-14 LAB — PHOSPHORUS: PHOSPHORUS: 2.8 mg/dL (ref 2.5–4.6)

## 2015-11-14 MED ORDER — FUROSEMIDE 10 MG/ML IJ SOLN
20.0000 mg | Freq: Two times a day (BID) | INTRAMUSCULAR | Status: DC
  Administered 2015-11-14 – 2015-11-15 (×3): 20 mg via INTRAVENOUS
  Filled 2015-11-14 (×3): qty 2

## 2015-11-14 NOTE — Progress Notes (Signed)
Pharmacy Antibiotic Note  Ann Yang is a 40 y.o. female admitted on 11/07/2015 with aspiration pneumonia.  Pharmacy has been following for Zosyn dosing.  Today is day #7 of Zosyn.    Plan: Dosage remains stable at Zosyn 3.375g IV q8h (4 hour infusion time) and need for further dosage adjustment appears unlikely at present.  Pharmacy will sign off at this time but will continue to monitor for antibiotic renal dose adjustments as needed while CCM is involved.  Height: 4\' 11"  (149.9 cm) Weight: 115 lb 4.8 oz (52.3 kg) IBW/kg (Calculated) : 43.2  Temp (24hrs), Avg:100.3 F (37.9 C), Min:98.7 F (37.1 C), Max:101.5 F (38.6 C)   Recent Labs Lab 11/07/15 1409  11/10/15 0430 11/11/15 0124 11/11/15 1020 11/12/15 0508 11/13/15 0436 11/13/15 0514 11/14/15 0442  WBC  --   < > 1.9*  --  1.1* 1.9* 4.4  --  11.0*  CREATININE  --   < > 0.71 0.67  --  0.62  --  0.83 0.69  LATICACIDVEN 3.75*  --   --   --   --   --   --   --   --   < > = values in this interval not displayed.  Estimated Creatinine Clearance: 69.1 mL/min (by C-G formula based on Cr of 0.69).    No Known Allergies  Antimicrobials this admission: 6/22 Azith x 1 6/22 CTX x 1 6/23 Zosyn  >>   Microbiology results:  6/22 BCx: NGF 6/22 MRSA PCR: neg 6/26 Cdiff neg 6/26 BCx: ngtd 6/26 trach asp cx: moderate yeast 6/26 UCx: NGF  Thank you for allowing pharmacy to be a part of this patient's care.  Clance BollRunyon, Schawn Byas 11/14/2015 10:10 AM

## 2015-11-14 NOTE — Progress Notes (Signed)
Received transfer of call from patient's mother, Ann Yang.. Rapid speech pattern; I categorize as "stressed" and "overwhelmed".  Went directly into an aggressive monologue without giving me much opportunity to speak..Countered nearly every comment I made with a rebuttal.  Acknowledged that she had made daughter DNR but wanted assurance that if daughter survived that DNR would not follow automatically to next hospital admission. Mother states that someone at group home told her that once a DNR, always a DNR. Despite being intubated, mother stated that she did not understand that her daughter was critical until yesterday and she feels that nurses should have told her how sick her daughter was. Mother states that only reason she was intubated was that she was fighting the oxygen and was needing restraints.  I gave her my name and title.  Ann Yang [name], RN, BSN, Pensions consultantursing Administrative Coordinator

## 2015-11-14 NOTE — Progress Notes (Signed)
Received phone call from pt's mother, Lanora Manislizabeth.  She sounded flustered, and she was stating that she was told by the workers at the pt's group home, that the pt was "doing much better, and no longer had the breathing tube in place."  She stated that she didn't know what information to believe.   Lanora Manislizabeth (mother) was made aware that no such information was provided to the pt's group home.  She was assured that what was told to her in the previous phone call for an update, still remained true, that the patient "was still on the breathing machine and requiring sedation."  She then stated that she understood, and that she was still planning to visit later this afternoon.

## 2015-11-14 NOTE — Progress Notes (Addendum)
PULMONARY / CRITICAL CARE MEDICINE   Name: Ann Yang MRN: [id number] DOB: 1975-05-25    ADMISSION DATE:  11/07/2015 CONSULTATION DATE:  11/07/2015  REFERRING MD:  Dr. Cena BentonVega  CHIEF COMPLAINT:  Short of breath  40 yo female with aspiration pneumonia, acute hypoxic respiratory failure after choking on hot dog. PMH - Down syndrome /mental retardation and seizure disorder anxiety and depression, resident of assisted living facility  SUBJECTIVE:  Not weaning  VITAL SIGNS: BP 113/59 mmHg  Pulse 119  Temp(Src) 98.9 F (37.2 C) (Oral)  Resp 20  Ht 4\' 11"  (1.499 m)  Wt 115 lb 4.8 oz (52.3 kg)  BMI 23.28 kg/m2  SpO2 95%  HEMODYNAMICS: CVP:  [10 mmHg] 10 mmHg  VENTILATOR SETTINGS: Vent Mode:  [-] PRVC FiO2 (%):  [40 %] 40 % Set Rate:  [18 bmp] 18 bmp Vt Set:  [350 mL] 350 mL PEEP:  [5 cmH20] 5 cmH20 Pressure Support:  [8 cmH20] 8 cmH20 Plateau Pressure:  [17 cmH20-21 cmH20] 17 cmH20  INTAKE / OUTPUT: I/O last 3 completed shifts: In: 4466.2 [I.V.:1967.4; NG/GT:2248.8; IV Piggyback:250] Out: 4180 [Urine:4180]  PHYSICAL EXAMINATION: General:  Intubated ,sedated with right ward neck contracture Neuro:  Sedated on vent HEENT:  No JVD, neck contracture Cardiovascular:  Regular, no MRG Lungs: Clear, decreased bs bases Abdomen:  Soft, non tender Musculoskeletal:  No edema Skin:  No rashes  LABS:  BMET  Recent Labs Lab 11/12/15 0508 11/13/15 0514 11/14/15 0442  NA 132* 144 143  K 3.2* 3.3* 3.3*  CL 96* 100* 100*  CO2 30 41* 40*  BUN 7 7 10   CREATININE 0.62 0.83 0.69  GLUCOSE 160* 164* 149*    Electrolytes  Recent Labs Lab 11/12/15 0508 11/13/15 0514 11/14/15 0442  CALCIUM 6.8* 7.5* 7.4*  MG 1.9 2.1 2.1  PHOS 1.4* 1.9* 2.8    CBC  Recent Labs Lab 11/12/15 0508 11/13/15 0436 11/14/15 0442  WBC 1.9* 4.4 11.0*  HGB 8.8* 9.2* 9.3*  HCT 26.3* 28.5* 28.7*  PLT 91* 123* 128*    Coag's No results for input(s): APTT, INR in the last 168  hours.  Sepsis Markers  Recent Labs Lab 11/07/15 1409  LATICACIDVEN 3.75*    ABG  Recent Labs Lab 11/08/15 1230 11/10/15 0434  PHART 7.328* 7.280*  PCO2ART 47.9* 61.7*  PO2ART 86.1 143*    Liver Enzymes  Recent Labs Lab 11/07/15 1400  AST 31  ALT 19  ALKPHOS 54  BILITOT 1.2  ALBUMIN 4.1    Cardiac Enzymes  Recent Labs Lab 11/13/15 0628 11/13/15 1135 11/13/15 1740  TROPONINI 0.08* 0.08* 0.08*    Glucose  Recent Labs Lab 11/13/15 1136 11/13/15 1532 11/13/15 1946 11/13/15 2349 11/14/15 0439 11/14/15 0717  GLUCAP 173* 174* 138* 157* 142* 139*    Imaging Dg Chest Port 1 View  11/14/2015  CLINICAL DATA:  Respiratory failure EXAM: PORTABLE CHEST 1 VIEW COMPARISON:  11/13/2015 FINDINGS: Endotracheal tube remains in good position. Central venous catheter tip in the SVC is unchanged. NG tube enters the stomach. Improvement in diffuse bilateral airspace disease predominantly in the bases. There remains significant bilateral airspace disease as well as bilateral atelectasis. Small bilateral effusions. No pneumothorax. IMPRESSION: Support lines remain in good position Improvement in diffuse bilateral airspace disease most likely due to clearing edema. Significant bibasilar atelectasis remains. Small pleural effusions. Electronically Signed   By: Marlan Palauharles  Clark M.D.   On: 11/14/2015 07:12    STUDIES:   CULTURES: 6/22 Blood >>ng 6/23  resp >> 6/26 C diff > Neg  ANTIBIOTICS: Rocephin 6/22 > 6/23  Zithromax 6/22 >> 6/23  Zosyn 6/23 >>  SIGNIFICANT EVENTS: 6/22 Admit 6/23 intubated - would not keep O2 mask on  LINES/TUBES: 6/23 ETT >> 6/23 lIJ >>  DISCUSSION: ARDS, septic shock with breakthrough agitation causing vent asynchrony and underlying valvular dz  ASSESSMENT / PLAN:  PULMONARY A: Acute hypoxic respiratory failure/ ARDS  2nd to aspiration pneumonia. P:   Titrating PEEP and FiO2 Start wake up and breath assessments  CARDIOVASCULAR A:  Septic shock 6/28  cortisol 17.4 6/28 2 d  Ef 65%, +MVR, +Septal defect, Partial AV canal defect, MV leaf flailed with LVOT obstruction, RSVP 70 mm/mg, +pericardial effusion m-> moderate P: On Levo. Titrate off Check echo, as noted above CVTS reviewed and declined intervention. Start gentle diuresis.  RENAL Lab Results  Component Value Date   CREATININE 0.69 11/14/2015   CREATININE 0.83 11/13/2015   CREATININE 0.62 11/12/2015    A:   AKI -resolved. Still with low UO Hypophos P:   Monitor renal fx, urine outpt  GASTROINTESTINAL A:   Dysphagia. P:   Continue tube feeds  HEMATOLOGIC A:   Thrombocytopenia - Improving Leukopenia - Improving P:  F/u CBC SQ heparin for DVT prophylaxis.  INFECTIOUS A:   Aspiration pneumonia. P:   Continue zosyn  ENDOCRINE A:   Hx of hypothyroidism. P:   Continue synthroid  NEUROLOGIC A:   Hx of Down syndrome, mental retardation, anxiety, depression. Hx of Seizures. P:   Continue xanax, aricept, lamictal, risperdal, zoloft  Steve Minor ACNP [facility] Pager [phone] till 3 pm If no answer page [phone] 11/14/2015, 9:44 AM   Attending note: I have seen and examined the patient with nurse practitioner/resident and agree with the note. History, labs and imaging reviewed.  40 Y/O with downs syndrome, MR admitted with aspiration PNA, septic shock, VDRF. She continues to require vent and pressor support.  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 I am afraid that getting her off the vent will be challenging with her cardiac issues. She is not a surgical candidate in current state.  Plan: We will continue abx Wean off pressors Start gentle diuresis as she is grossly volume overloaded > may help with heart function. Rest of plan as above.  I had discussions with her mom who is the health care proxy even though the patient lives with a caretaker. The mother is  aware of the critical nature of her daughter's illness. She does not want her daughter to suffer and is inclined to make her DNR.  There is resistance to this from her caretaker who wants her to be full code. We will continue these discussions.  Critical care time- 45 mins. This represents my time independent of the NPs time taking care of the patient.  Chilton GreathousePraveen Letroy Vazguez MD [facility] Pager [phone] If no answer or after 3pm call: [phone] 11/14/2015, 12:46 PM   Addendum: Spoke with mother again. She wants her daughter to be DNR. Orders placed..Marland Kitchen

## 2015-11-14 NOTE — Progress Notes (Signed)
Pt's R eye assessed to be swollen this AM, NP made aware.  Ice applied, swelling has since decreased.  Pt has new redness assessed around neck and chin, MD made aware.  Will continue to monitor.

## 2015-11-14 NOTE — Progress Notes (Signed)
Received phone call from pt's mom, Ann Yang, very pressured speech and very panicky requesting to speak with a supervisor. This RN asked if she needed the hospital supervisor or that I was the care coordinator in ICU today did she need to speak with me. Ann Yang then stated she needed someone with more authority than me. I transferred her to the Nursing Supervisor, Pamala DuffelShearer Bridges.

## 2015-11-14 NOTE — Progress Notes (Signed)
Received phone call from pt's mother, Ann Yang, who was asking the status of her daughter.  An update was provided telling the mother that her daughter was still on the ventilator, and was still on sedation medication.  She stated she would be up to visit later on in the afternoon.

## 2015-11-14 NOTE — Progress Notes (Signed)
Nutrition Follow-up  DOCUMENTATION CODES:   Non-severe (moderate) malnutrition in context of chronic illness, Underweight  INTERVENTION:  - Continue Jevity 1.2 @ goal rate of 50 mL/hr. This + kcal from current Propofol is providing 1503 kcal (97% estimated need), 67 grams of protein, and 968 mL free water. - Continue PEPuP protocol.  - Continue free water flush per MD/NP order.  - RD will follow-up 6/30.  NUTRITION DIAGNOSIS:   Inadequate oral intake related to inability to eat as evidenced by NPO status. -ongoing  GOAL:   Patient will meet greater than or equal to 90% of their needs -met with current regimen.  MONITOR:   Vent status, TF tolerance, Weight trends, Labs, I & O's  ASSESSMENT:   40 y.o. female with medical history significant of MR, seizure disorder, anxiety, depression. Presenting to the hospital after aspiration event requiring Heimlich maneuver. Patient was evaluated and found to be hypoxic and subsequently presented to the hospital for further evaluation. Unable to obtain history secondary to advanced MR  6/29 Pt continues with OGT and currently receiving TF at goal rate: Jevity 1.2 @ 50 mL/hr which is providing 1440 kcal, 67 grams, and 968 mL free water. Pt also receiving 200 mL free water every 6 hours (800 mL/24 hours). Defer free water flush to MD/NP given ongoing cardiac issues. Notes indicate that pt is not a cardiac surgery candidate.   Patient is currently intubated on ventilator support MV: 10.4 L/min Temp (24hrs), Avg:100.3 F (37.9 C), Min:98.7 F (37.1 C), Max:101.5 F (38.6 C) Propofol: 2.4 ml/hr (63 kcal).  Nutrition needs updated based on current Ve and Tmax with admission weight (46.3 kg) used to calculate needs. Continue current TF regimen and RD will follow-up tomorrow.   Medications reviewed; sliding scale Novolog, 50 mcg Synthroid/day, 40 mg Protonix/day. Labs reviewed; CBGs: 139 and 142 mg/dL this AM, K: 3.3 mmol/L, Cl: 100 mmol/L, Ca:  7.4 mg/dL.  Drips: Levo titrated off, Propofol @ 8 mcg/kg/min.    6/28 - Pt continues with OGT and Jevity 1.2 @ 45 mL/hr with 200 mL free water every 6 hours. This regimen is providing 1296 kcal, 60 grams of protein, and 1672 mL free water.  - Dr. Matilde Bash note from this AM states AKI resolved but pt continues with low urine output.  - Patient is currently intubated on ventilator support; MV: 5.4 L/min; Temp (24hrs),  Max:101.6 F (38.7 C); Propofol: now off - Per imaging today 6/26: FINDINGS: Cardiomegaly with diffuse bilateral pulmonary infiltrates and pleural effusions consistent congestive heart failure. Slight worsening from prior exams. No pneumothorax. IMPRESSION: Cardiomegaly diffuse bilateral pulmonary infiltrates and bilateral pleural effusions consistent congestive heart failure. Slight worsening from prior exam . - Dr. Matilde Bash note states pt doing well with weaning from vent this AM; will monitor for course related to this progress.   Drip: Vaso off since yesterday, Levo weaning and now at 17 mcg/min.    6/27 - Pt with OGT in place and currently receiving goal rate TF: Jevity 1.2 @ 45 mL/hr with 200 mL free water every 6 hours.  - This regimen is providing 1296 kcal, 60 grams of protein, and 1272 mL free water.  - Weight of 46.3 kcal from 6/25 used to calculate needs. Weight up 15 lbs since that time.  - Free water flush per MD order given current IVF + electrolyte abnormalities. No current order for free water flush.  - Patient is currently intubated on ventilator support; MV: 6.7 L/min; Temp (24hrs), Max:100 F (  37.8 C); Propofol: 1.2 ml/hr (32 kcal) - RN reports that wakeup assessment in progress at this time.  - Current TF regimen + kcal from Propofol is providing 1328 kcal.   IVF: NS @ 75 mL/hr. Drips: Fentanyl @ 25 mcg/hr, Propofol @ 5 mcg/kg/min, Levo @ 11 mcg/min.   Diet Order:  Diet NPO time specified  Skin:  Reviewed, no issues  Last BM:   6/28  Height:   Ht Readings from Last 1 Encounters:  11/10/15 4' 11"  (1.499 m)    Weight:   Wt Readings from Last 1 Encounters:  11/14/15 115 lb 4.8 oz (52.3 kg)    Ideal Body Weight:  42.91 kg (kg)  BMI:  Body mass index is 23.28 kg/(m^2).  Estimated Nutritional Needs:   Kcal:  1556  Protein:  56-69 grams (1.2-1.5 grams/kg)  Fluid:  1.5 L/day  EDUCATION NEEDS:   No education needs identified at this time     Jarome Matin, MS, RD, LDN Inpatient Clinical Dietitian Pager # [phone] After hours/weekend pager # [phone]

## 2015-11-14 NOTE — Progress Notes (Signed)
310 mL of Fentanyl gtt was wasted with Verdia KubaZoe Suggs, RN, as witness.

## 2015-11-15 ENCOUNTER — Inpatient Hospital Stay (HOSPITAL_COMMUNITY): Payer: Medicare Other

## 2015-11-15 LAB — GLUCOSE, CAPILLARY
GLUCOSE-CAPILLARY: 174 mg/dL — AB (ref 65–99)
Glucose-Capillary: 140 mg/dL — ABNORMAL HIGH (ref 65–99)
Glucose-Capillary: 168 mg/dL — ABNORMAL HIGH (ref 65–99)
Glucose-Capillary: 204 mg/dL — ABNORMAL HIGH (ref 65–99)
Glucose-Capillary: 103 mg/dL — ABNORMAL HIGH (ref 65–99)

## 2015-11-15 LAB — BLOOD GAS, ARTERIAL
Acid-Base Excess: 15.9 mmol/L — ABNORMAL HIGH (ref 0.0–2.0)
Bicarbonate: 41.3 mEq/L — ABNORMAL HIGH (ref 20.0–24.0)
DRAWN BY: 308601
FIO2: 0.4
O2 SAT: 92.5 %
PATIENT TEMPERATURE: 37
PCO2 ART: 53.5 mmHg — AB (ref 35.0–45.0)
PEEP: 5 cmH2O
PH ART: 7.499 — AB (ref 7.350–7.450)
PO2 ART: 61.9 mmHg — AB (ref 80.0–100.0)
RATE: 18 resp/min
TCO2: 38.1 mmol/L (ref 0–100)
VT: 350 mL

## 2015-11-15 LAB — CBC
HEMATOCRIT: 25.2 % — AB (ref 36.0–46.0)
HEMOGLOBIN: 8.4 g/dL — AB (ref 12.0–15.0)
MCH: 32.4 pg (ref 26.0–34.0)
MCHC: 33.3 g/dL (ref 30.0–36.0)
MCV: 97.3 fL (ref 78.0–100.0)
Platelets: 118 10*3/uL — ABNORMAL LOW (ref 150–400)
RBC: 2.59 MIL/uL — ABNORMAL LOW (ref 3.87–5.11)
RDW: 14.3 % (ref 11.5–15.5)
WBC: 8.9 10*3/uL (ref 4.0–10.5)

## 2015-11-15 LAB — URINE CULTURE
CULTURE: NO GROWTH
Special Requests: NORMAL

## 2015-11-15 LAB — BASIC METABOLIC PANEL
Anion gap: 7 (ref 5–15)
BUN: 12 mg/dL (ref 6–20)
CHLORIDE: 92 mmol/L — AB (ref 101–111)
CO2: 38 mmol/L — AB (ref 22–32)
CREATININE: 0.67 mg/dL (ref 0.44–1.00)
Calcium: 7.2 mg/dL — ABNORMAL LOW (ref 8.9–10.3)
GFR calc Af Amer: >60 mL/min (ref >60)
GFR calc non Af Amer: >60 mL/min (ref >60)
GLUCOSE: 153 mg/dL — AB (ref 65–99)
Potassium: 3.2 mmol/L — ABNORMAL LOW (ref 3.5–5.1)
Sodium: 137 mmol/L (ref 135–145)

## 2015-11-15 LAB — PHOSPHORUS: PHOSPHORUS: 3.7 mg/dL (ref 2.5–4.6)

## 2015-11-15 LAB — MAGNESIUM: MAGNESIUM: 2 mg/dL (ref 1.7–2.4)

## 2015-11-15 MED ORDER — NOREPINEPHRINE BITARTRATE 1 MG/ML IV SOLN
0.0000 ug/min | INTRAVENOUS | Status: DC
  Filled 2015-11-15: qty 16

## 2015-11-15 MED ORDER — ACETAMINOPHEN 160 MG/5ML PO SOLN: 650.0000 mg | Freq: Four times a day (QID) | ORAL | Status: AC | PRN

## 2015-11-15 MED ORDER — CHLORHEXIDINE GLUCONATE 0.12% ORAL RINSE (MEDLINE KIT): 15.0000 mL | Freq: Two times a day (BID) | OROMUCOSAL | Status: AC

## 2015-11-15 MED ORDER — FUROSEMIDE 10 MG/ML IJ SOLN: 20.0000 mg | Freq: Two times a day (BID) | INTRAMUSCULAR | Status: AC

## 2015-11-15 MED ORDER — ACETAZOLAMIDE SODIUM 500 MG IJ SOLR
250.0000 mg | Freq: Once | INTRAMUSCULAR | Status: AC
  Administered 2015-11-15: 250 mg via INTRAVENOUS
  Filled 2015-11-15: qty 500

## 2015-11-15 MED ORDER — HEPARIN SODIUM (PORCINE) 5000 UNIT/ML IJ SOLN
5000.0000 [IU] | Freq: Three times a day (TID) | INTRAMUSCULAR | Status: AC
Start: 2015-11-15 — End: ?

## 2015-11-15 MED ORDER — POTASSIUM CHLORIDE 20 MEQ/15ML (10%) PO SOLN
30.0000 meq | ORAL | Status: AC
  Administered 2015-11-15: 30 meq
  Filled 2015-11-15: qty 30

## 2015-11-15 MED ORDER — LAMOTRIGINE 100 MG PO TABS: 100.0000 mg | ORAL_TABLET | Freq: Two times a day (BID) | ORAL | Status: AC

## 2015-11-15 MED ORDER — PROPOFOL 1000 MG/100ML IV EMUL: 0.0000 ug/kg/min | INTRAVENOUS | Status: AC

## 2015-11-15 MED ORDER — MIDAZOLAM HCL 2 MG/2ML IJ SOLN: 2.0000 mg | INTRAMUSCULAR | Status: AC | PRN

## 2015-11-15 MED ORDER — FENTANYL BOLUS VIA INFUSION: 50.0000 ug | INTRAVENOUS | Status: AC | PRN

## 2015-11-15 MED ORDER — POTASSIUM CHLORIDE 20 MEQ/15ML (10%) PO SOLN: 30.0000 meq | ORAL | Status: AC

## 2015-11-15 MED ORDER — PIPERACILLIN-TAZOBACTAM 3.375 G IVPB: 3.3750 g | Freq: Three times a day (TID) | INTRAVENOUS | Status: AC

## 2015-11-15 MED ORDER — NOREPINEPHRINE BITARTRATE 1 MG/ML IV SOLN: 0.0000 ug/min | INTRAVENOUS | Status: AC

## 2015-11-15 MED ORDER — INSULIN ASPART 100 UNIT/ML ~~LOC~~ SOLN: 0.0000 [IU] | SUBCUTANEOUS | Status: AC

## 2015-11-15 MED ORDER — JEVITY 1.2 CAL PO LIQD: 1000.0000 mL | ORAL | Status: DC

## 2015-11-15 MED ORDER — PANTOPRAZOLE SODIUM 40 MG PO PACK: 40.0000 mg | PACK | ORAL | Status: AC

## 2015-11-15 MED ORDER — JEVITY 1.2 CAL PO LIQD: 1000.0000 mL | ORAL | Status: AC

## 2015-11-15 MED ORDER — ONDANSETRON HCL 4 MG/2ML IJ SOLN: 4.0000 mg | Freq: Four times a day (QID) | INTRAMUSCULAR | Status: AC | PRN

## 2015-11-15 NOTE — Progress Notes (Signed)
Date: November 15 2015 No needs present at time of discharge.   [facility].  Papers and forms prepared. Marcelle Smilinghonda Davis, RN, BSN, [place]   [phone]

## 2015-11-15 NOTE — Progress Notes (Signed)
[facility] Pt going to room 7710.

## 2015-11-15 NOTE — Progress Notes (Signed)
Spke with RN Loletta ParishJessica Kaat who requested changing levophed to quad strength [facility]  RN aware to adjust pump concentrations.   Haynes Hoehnolleen Tyshawn Keel, PharmD, BCPS 11/15/2015, 9:27 PM  Pager: [phone]

## 2015-11-15 NOTE — Progress Notes (Addendum)
Nutrition Follow-up  DOCUMENTATION CODES:   Non-severe (moderate) malnutrition in context of chronic illness, Underweight  INTERVENTION:  - Will decrease Jevity 1.2 to new goal rate of 45 mL/hr. This regimen + kcal from current Propofol rate will provide 1454 kcal, 60 grams of protein, and 872 mL free water. - Continue PEPuP protocol. - Free water flush per MD/NP order. - RD will follow-up 7/3.  NUTRITION DIAGNOSIS:   Inadequate oral intake related to inability to eat as evidenced by NPO status. -ongoing  GOAL:   Patient will meet greater than or equal to 90% of their needs -met with current regimen.  MONITOR:   Vent status, TF tolerance, Weight trends, Labs, I & O's  ASSESSMENT:   40 y.o. female with medical history significant of MR, seizure disorder, anxiety, depression. Presenting to the hospital after aspiration event requiring Heimlich maneuver. Patient was evaluated and found to be hypoxic and subsequently presented to the hospital for further evaluation. Unable to obtain history secondary to advanced MR  6/30 Pt continues with OGT is and continues to receive goal rate TF: Jevity 1.2 @ 50 mL/hr. Continue to defer free water flush to MD/NP order given cardiac issues. RN reports pt has been agitated today. Nutrition needs re-estimated based on Ve and temperature.   Patient is currently intubated on ventilator support MV: 10.5 L/min Temp (24hrs), Avg:99.5 F (37.5 C), Min:98.4 F (36.9 C), Max:102.5 F (39.2 C) Propofol: 6 ml/hr (158 kcal).  Will adjust TF regimen as outlined above based on updated nutrition needs, kcal provided by current Propofol rate. RD will follow-up 6/3. Medications reviewed;  20 mg IV Lasix every 12 hours, sliding scale Novolog, 50 mcg Synthroid/day, 40 mg Protonix/day. Labs reviewed; CBGs: 140 and 147 mg/day this AM, K: 3.2 mmol/L, Cl: 92 mmol/L, Ca: 7.2 mg/dL.  Drips: Propofol @ 20 mcg/kg/min, Levo @ 7 mcg/min.    6/29 - Pt continues with  OGT and currently receiving TF at goal rate: Jevity 1.2 @ 50 mL/hr which is providing 1440 kcal, 67 grams, and 968 mL free water.  - Pt also receiving 200 mL free water every 6 hours (800 mL/24 hours).  - Defer free water flush to MD/NP given ongoing cardiac issues.  - Notes indicate that pt is not a cardiac surgery candidate.  - Patient is currently intubated on ventilator support; MV: 10.4 L/min; Temp (24hrs), Avg:100.3 F (37.9 C), Max:101.5 F (38.6 C); Propofol: 2.4 ml/hr (63 kcal). - Nutrition needs updated based on current Ve and Tmax with admission weight (46.3 kg) used to calculate needs.   Drips: Levo titrated off, Propofol @ 8 mcg/kg/min.   6/28 - Pt continues with OGT and Jevity 1.2 @ 45 mL/hr with 200 mL free water every 6 hours. This regimen is providing 1296 kcal, 60 grams of protein, and 1672 mL free water.  - Dr. Matilde Bash note from this AM states AKI resolved but pt continues with low urine output.  - Patient is currently intubated on ventilator support; MV: 5.4 L/min; Temp (24hrs), Max:101.6 F (38.7 C); Propofol: now off - Per imaging today 6/26: FINDINGS: Cardiomegaly with diffuse bilateral pulmonary infiltrates and pleural effusions consistent congestive heart failure. Slight worsening from prior exams. No pneumothorax. IMPRESSION: Cardiomegaly diffuse bilateral pulmonary infiltrates and bilateral pleural effusions consistent congestive heart failure. Slight worsening from prior exam . - Dr. Matilde Bash note states pt doing well with weaning from vent this AM; will monitor for course related to this progress.   Drip: Vaso off since yesterday,  Levo weaning and now at 17 mcg/min.    Diet Order:  Diet NPO time specified  Skin:  Reviewed, no issues  Last BM:  6/30  Height:   Ht Readings from Last 1 Encounters:  11/10/15 4' 11"  (1.499 m)    Weight:   Wt Readings from Last 1 Encounters:  11/15/15 106 lb 14.8 oz (48.5 kg)    Ideal Body Weight:  42.91 kg  (kg)  BMI:  Body mass index is 21.58 kg/(m^2).  Estimated Nutritional Needs:   Kcal:  6666  Protein:  56-69 grams (1.2-1.5 grams/kg)  Fluid:  1.5 L/day  EDUCATION NEEDS:   No education needs identified at this time     Jarome Matin, MS, RD, LDN Inpatient Clinical Dietitian Pager # [phone] After hours/weekend pager # [phone]

## 2015-11-15 NOTE — Progress Notes (Addendum)
PULMONARY / CRITICAL CARE MEDICINE   Name: Ann Yang MRN: [id number] DOB: 1975/08/11    ADMISSION DATE:  11/07/2015 CONSULTATION DATE:  11/07/2015  REFERRING MD:  Dr. Cena BentonVega  CHIEF COMPLAINT:  Short of breath  40 yo female with aspiration pneumonia, acute hypoxic respiratory failure after choking on hot dog. PMH - Down syndrome /mental retardation and seizure disorder anxiety and depression, resident of assisted living facility  SUBJECTIVE:  Failed weaning trial today AM. More awake  VITAL SIGNS: BP 100/51 mmHg  Pulse 79  Temp(Src) 99.9 F (37.7 C) (Oral)  Resp 20  Ht 4\' 11"  (1.499 m)  Wt 106 lb 14.8 oz (48.5 kg)  BMI 21.58 kg/m2  SpO2 95%  HEMODYNAMICS:    VENTILATOR SETTINGS: Vent Mode:  [-] PRVC FiO2 (%):  [40 %] 40 % Set Rate:  [18 bmp] 18 bmp Vt Set:  [350 mL] 350 mL PEEP:  [5 cmH20] 5 cmH20 Plateau Pressure:  [16 cmH20-24 cmH20] 16 cmH20  INTAKE / OUTPUT: I/O last 3 completed shifts: In: 3390.6 [I.V.:1315.6; NG/GT:1850; IV Piggyback:225] Out: 5380 [Urine:5380]  PHYSICAL EXAMINATION: General:  Awake, appears anxious Neuro:  Moves all extremities HEENT:  No JVD, neck contracture Cardiovascular:  RRR, no MRG Lungs: Clear, No wheeze or crackles Abdomen:  Soft, non tender Musculoskeletal:  1-2 + edema Skin:  No rashes  LABS:  BMET  Recent Labs Lab 11/13/15 0514 11/14/15 0442 11/15/15 0546  NA 144 143 137  K 3.3* 3.3* 3.2*  CL 100* 100* 92*  CO2 41* 40* 38*  BUN 7 10 12   CREATININE 0.83 0.69 0.67  GLUCOSE 164* 149* 153*    Electrolytes  Recent Labs Lab 11/13/15 0514 11/14/15 0442 11/15/15 0546  CALCIUM 7.5* 7.4* 7.2*  MG 2.1 2.1 2.0  PHOS 1.9* 2.8 3.7    CBC  Recent Labs Lab 11/13/15 0436 11/14/15 0442 11/15/15 0546  WBC 4.4 11.0* 8.9  HGB 9.2* 9.3* 8.4*  HCT 28.5* 28.7* 25.2*  PLT 123* 128* 118*    Coag's No results for input(s): APTT, INR in the last 168 hours.  Sepsis Markers No results for input(s):  LATICACIDVEN, PROCALCITON, O2SATVEN in the last 168 hours.  ABG  Recent Labs Lab 11/08/15 1230 11/10/15 0434 11/15/15 0340  PHART 7.328* 7.280* 7.499*  PCO2ART 47.9* 61.7* 53.5*  PO2ART 86.1 143* 61.9*    Liver Enzymes No results for input(s): AST, ALT, ALKPHOS, BILITOT, ALBUMIN in the last 168 hours.  Cardiac Enzymes  Recent Labs Lab 11/13/15 0628 11/13/15 1135 11/13/15 1740  TROPONINI 0.08* 0.08* 0.08*    Glucose  Recent Labs Lab 11/14/15 1134 11/14/15 1517 11/14/15 2016 11/14/15 2349 11/15/15 0343 11/15/15 0840  GLUCAP 169* 139* 159* 123* 140* 174*    Imaging Dg Chest Port 1 View  11/15/2015  CLINICAL DATA:  Community-acquired pneumonia, possibly related aspiration, intubated patient EXAM: PORTABLE CHEST 1 VIEW COMPARISON:  Portable chest x-ray of November 14, 2015 FINDINGS: The lungs are mildly hypoinflated. Confluent alveolar opacities occupy the mid and lower thirds of both hemi thoraces. The hemidiaphragms are obscured. There is no pneumothorax. The cardiac silhouette is enlarged. The pulmonary vascularity is engorged. The endotracheal tube tip lies 2.8 cm above the carina. The esophagogastric tube tip projects below the inferior margin of the image. The left internal jugular venous catheter tip projects over the midportion of the SVC. A IMPRESSION: Bilateral pneumonia, superimposed CHF. The support tubes are in stable position. No significant change since yesterday's study. Electronically Signed   By: Onalee Huaavid  [place] M.D.   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 This is a significant decompensation compared [facility] I am afraid that getting her off the vent will be challenging with her cardiac issues. She is not a surgical candidate in current state.  ASSESSMENT / PLAN:  PULMONARY A: Acute hypoxic respiratory failure/ ARDS  2nd to aspiration pneumonia. P:   Titrating PEEP and FiO2 Start wake up and breath assessments  CARDIOVASCULAR A:  Septic shock Decompensated congential heart disease 6/28 2 d  Ef 65%, +MVR, +Septal defect, Partial AV canal defect, MV leaf flailed with LVOT obstruction, RSVP 70 mm/mg, +pericardial effusion m-> moderate P: On Levo. Titrate to off Follow CVP CVTS reviewed case and declined intervention. Press ahead with diuresis as she is grossly volume overloaded > may help with heart function. I sent an email and call to her [facility] cardiologist Dr Merryl Hackerhomas Bashore to discuss management of heart failure, possible transfer.  RENAL A:   AKI -resolved. UO improving with diuresis Metabolic alkalosis P:   Monitor renal fx, urine outpt Continue lasix Give once dose diamox to help with alkalosis  GASTROINTESTINAL A:   Dysphagia. P:   Continue tube feeds  HEMATOLOGIC A:   Thrombocytopenia - Improving Leukopenia - Improving P:  F/u CBC SQ heparin for DVT prophylaxis.  INFECTIOUS A:   Aspiration pneumonia. P:   Continue zosyn. D/C after today Check Pct  ENDOCRINE A:   Hx of hypothyroidism. P:   Continue synthroid  NEUROLOGIC A:   Hx of Down syndrome, mental retardation, anxiety, depression. Hx of Seizures. P:   Continue aricept, lamictal, risperdal, zoloft.  Family:  I had discussions with her mom over the week who is the health care proxy even though the patient lives with a caretaker. The mother is aware of the critical nature of her daughter's illness. She does not  want her daughter to suffer and has made DNR.  There is resistance to this from her caretaker but mother has decided to keep DNR in place. If she should improve and survive this then she want her daughter to be full code again.  Critical care time- 45 mins.  Chilton GreathousePraveen Jenisis Harmsen MD [facility] Pager [phone] If no answer or after 3pm call: [phone] 11/15/2015, 10:02 AM   Addendum:  I [facility] Discussed the echo findings. He felt that the PA pressure is probably not very high give the VSD gradient is 70. However the flail MV need to be assessed further with TEE They have accepted the patient as a transfer pending bed availability.  Chilton GreathousePraveen Naiara Lombardozzi MD [facility] Pager [phone] If no answer or after 3pm call: [phone] 11/15/2015, 11:00 AM

## 2015-11-15 NOTE — Discharge Summary (Signed)
Physician Discharge Summary  Patient ID: SUMMIT BORCHARDT MRN: [id number] DOB/AGE: 1975/11/13 40 y.o.  Admit date: 11/07/2015 Discharge date: 11/15/2015    Discharge Diagnoses:  Acute Hypoxic Respiratory Failure with ARDS secondary to Aspiration PNA  Septic Shock  Decompensated Congenital Heart Disease  Endocardial Cushion Defect Valvular Disease Pulmonary Hypertension  Acute Kidney Injury  Metabolic Alkalosis secondary to Diuresis  Dysphagia  Thrombocytopenia  Leukopenia  Hypothyroidism  Down Syndrome Anxiety / Depression  Seizure Disorder                                                                         DISCHARGE PLAN BY DIAGNOSIS     Acute Hypoxic Respiratory Failure with ARDS secondary to Aspiration PNA   Discharge Plan: ARDS protocol, low Vt ventilation  Wean PEEP / FiO2 as tolerated  Daily WUA / SBT Continue Zosyn Trend PCT, CXR  Septic Shock - adrenal insufficiency ruled out Decompensated Congenital Heart Disease  Endocardial Cushion Defect Valvular Disease Pulmonary Hypertension  Pericardial Effusion - moderate, without evidence of tamponade  Discharge Plan: Levophed gtt for MAP >65 Trend CVP Diuresis for volume overload  Transfer to [facility] for cardiac / surgical evaluation   Acute Kidney Injury  Metabolic Alkalosis secondary to Diuresis   Discharge Plan: Monitor BMP / UOP  Continue lasix as below Diamox x1 for contraction alkalosis  Replace electrolytes as indicated   Dysphagia   Discharge Plan: Continue tube feeding per nutrition   Thrombocytopenia - improved Leukopenia - improved   Discharge Plan: Monitor CBC Heparin for DVT prophylaxis   Hypothyroidism   Discharge Plan: Continue synthroid   Down Syndrome Anxiety / Depression  Seizure Disorder    Discharge Plan: Continue aricept, lamictal, risperdal, zoloft.    Goals of Care:  Prior discussions with her mom over the week who is the health care proxy even though the  patient lives with a caretaker. The mother is aware of the critical nature of her daughter's illness. She does not want her daughter to suffer and has made her a DNR. There is resistance to this from her caretaker but mother has decided to keep DNR in place. If she should improve and survive this then she want her daughter to be full code again.                DISCHARGE SUMMARY   JOSHUA ZERINGUE is a 40 y.o. y/o female, Assisted Living Resident (mother no longer able to care for patient),  with a PMH of Downs Syndrome, anxiety, depression, seizure disorder, hypothyroidism, endocardial cushion defect, & underlying valvular disease who presented to [facility] in [place] on 11/07/15 after an aspiration event (choking episode on a hot dog) that required the Heimlich maneuver on 3/78 pm.    She was initially taken to an Urgent Care for evaluation and noted to have saturations in the 60's on room air.  The patient was transferred to the [facility] for further evaluation where she was found the patient to be hypoxic requiring 100% NRB and hypotensive.  She treated with NS bolus.  CXR demonstrated bibasilar infiltrates.  She was empirically treated with rocephin and azithromycin by the Hospitalist service and admitted.  PCCM consulted for evaluation.  Unfortunately, the patient decompensated on 6/23 requiring intubation for respiratory distress.  Central line was placed in R IJ for vascular access.  The patient required vasopressors to maintain MAP >65.  ARDS protocol / low Vt ventialtion instituted due to bilateral infiltrates and need for 100% FiO2. ICU course complicated by persistent shock (neo changed to levophed, vasopressin), thrombocytopenia, AKI, break through agitation with ventilator dyssynchrony and fever.  Pan cultures to date are negative.  Antibiotics were narrowed to zosyn.  Home medications for seizures continued.  Cortisol assessed with persistent shock and was within normal  limits (cortisol 17.4 6/28).  Vasopressors eventually weaned to 10 mcg levophed.  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 This was concerning for significant decompensation compared to last ECHO at [facility] in 2016.  The patient was aggressively diuresed for volume overload (developed contraction alkalosis, diamox given x1).  She was evaluated by cardio-thoracic surgery and felt not to be a candidate for surgery at this time and recommended assessment at [facility]. Given recommendations, it was felt to be in the patients best interest to transfer to [facility] for evaluation.  Dr. Jamse Arn graciously accepted upon transfer, her primary cardiologist.  The patient was medically cleared for transfer to [facility] on 6/30 with plans as above.       STUDIES:  ECHO / TTE 6/28 >> There is a partial AV canal defect that includes:  1. A large septum primum atrial septal defect measuring 12 mm in diameter.  2. A VSD with left to right flow with gradient 70 mmHg and measured diameter 6 mm. 3. 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  4. Mitral valve leaflet is cleft, partially flail, causing LVOT obstruction with peak/mean gradient 56/26 mmHg.  There is a severe pulmonary hypertension with RVSP 75 mmHg.  There is mild to moderate circumferential pericardial effusion with no tamponade.  A TEE is recommended for further assessment.  CXR 6/30 >> bilateral pneumonia, superimposed CHF, support tubes in stable position, no significant change from 6/29     CULTURES: 6/22 Blood >> negative 6/26 resp >> moderate candida albicans 6/26 C diff > Negative 6/26  BCx2 >>  6/29 tracheal aspirate >> abundant WBC, both PMN, mono, few yeast >>  6/29  UC >> negative   ANTIBIOTICS: Rocephin 6/22 > 6/23  Zithromax 6/22 >> 6/23  Zosyn 6/23 >>  SIGNIFICANT EVENTS: 6/22 Admit 6/23 intubated - would not keep O2 mask on  LINES/TUBES: 6/23 ETT  >> 6/23 lIJ >>    VENTILATOR SETTINGS: Vent Mode: [-] PRVC FiO2 (%): [40 %] 40 % Set Rate: [18 bmp] 18 bmp Vt Set: [350 mL] 350 mL PEEP: [5 cmH20] 5 cmH20 Plateau Pressure: [16 cmH20-24 cmH20] 16 cmH20  INTAKE / OUTPUT: I/O last 3 completed shifts: In: 3390.6 [I.V.:1315.6; NG/GT:1850; IV Piggyback:225] Out: 9233 [Urine:5380]  PHYSICAL EXAMINATION: General: Awake, appears anxious Neuro: Moves all extremities HEENT: No JVD, neck contracture Cardiovascular: RRR, no MRG Lungs: Clear, No wheeze or crackles Abdomen: Soft, non tender Musculoskeletal: 1-2 + edema Skin: No rashes  Filed Vitals:   11/15/15 1130 11/15/15 1200 11/15/15 1213 11/15/15 1230  BP: 115/47 111/47  109/50  Pulse: 82 75  81  Temp:      TempSrc:      Resp: 23 20  19   Height:      Weight:      SpO2: 98% 97% 97% 98%     Discharge Labs  BMET  Recent Labs Lab 11/09/15 1700  11/11/15 0124 11/12/15 0508 11/13/15 0514 11/14/15 0442 11/15/15 0546  NA  --   < > 135 132* 144 143 137  K  --   < > 3.5 3.2* 3.3* 3.3* 3.2*  CL  --   < > 101 96* 100* 100* 92*  CO2  --   < > 30 30 41* 40* 38*  GLUCOSE  --   < > 166* 160* 164* 149* 153*  BUN  --   < > 8 7 7 10 12   CREATININE  --   < > 0.67 0.62 0.83 0.69 0.67  CALCIUM  --   < > 6.8* 6.8* 7.5* 7.4* 7.2*  MG 2.0  --   --  1.9 2.1 2.1 2.0  PHOS 1.9*  --   --  1.4* 1.9* 2.8 3.7  < > = values in this interval not displayed.  CBC  Recent Labs Lab 11/13/15 0436 11/14/15 0442 11/15/15 0546  HGB 9.2* 9.3* 8.4*  HCT 28.5* 28.7* 25.2*  WBC 4.4 11.0* 8.9  PLT 123* 128* 118*    Discharge Instructions    Call MD for:  difficulty breathing, headache or visual disturbances    Complete by:  As directed      Call MD for:  extreme fatigue    Complete by:  As directed      Call MD for:  hives    Complete by:  As directed      Call MD for:  persistant dizziness or light-headedness    Complete by:  As directed      Call MD for:  persistant  nausea and vomiting    Complete by:  As directed      Call MD for:  severe uncontrolled pain    Complete by:  As directed      Call MD for:  temperature >100.4    Complete by:  As directed      Discharge instructions    Complete by:  As directed   Transfer to [facility] > continue all supportive therapies in place for transport.     Increase activity slowly    Complete by:  As directed                 Follow-up Information    Follow up with [facility] .         Medication List    STOP taking these medications        ALPRAZolam 0.5 MG tablet  Commonly known as:  XANAX     LAMICTAL XR 100 MG Tb24  Generic drug:  LamoTRIgine  Replaced by:  lamoTRIgine 100 MG tablet     loratadine 10 MG tablet  Commonly known as:  CLARITIN      TAKE these medications        acetaminophen 160 MG/5ML solution  Commonly known as:  TYLENOL  Place 20.3 mLs (650 mg total) into feeding tube every 6 (six) hours as needed for fever (Temp >101.41F).     chlorhexidine gluconate (SAGE KIT) 0.12 % solution  Commonly known as:  PERIDEX  15 mLs by Mouth Rinse route 2 (two) times daily.     donepezil 10 MG tablet  Commonly known as:  ARICEPT  Take 10 mg by mouth at bedtime.     feeding supplement (JEVITY 1.2 CAL) Liqd  Place 1,000 mLs into feeding tube continuous.     fentaNYL Soln  Commonly known as:  SUBLIMAZE  Inject 50 mcg into the vein every hour as needed.     furosemide 10 MG/ML injection  Commonly known as:  LASIX  Inject 2 mLs (20 mg total) into the vein every 12 (twelve) hours.     heparin 5000 UNIT/ML injection  Inject 1 mL (5,000 Units total) into the skin every 8 (eight) hours.     insulin aspart 100 UNIT/ML injection  Commonly known as:  novoLOG  Inject 0-9 Units into the skin every 4 (four) hours.     lamoTRIgine 100 MG tablet  Commonly known as:  LAMICTAL  Place 1 tablet (100 mg total) into feeding tube 2 (two) times daily.     levothyroxine 50 MCG  tablet  Commonly known as:  SYNTHROID, LEVOTHROID  Take 50 mcg by mouth daily.     midazolam 2 MG/2ML Soln injection  Commonly known as:  VERSED  Inject 2 mLs (2 mg total) into the vein every 2 (two) hours as needed for agitation (to maintain RASS goal.).     norepinephrine 4 mg in dextrose 5 % 250 mL  Inject 0-40 mcg/min into the vein continuous.     ondansetron 4 MG/2ML Soln injection  Commonly known as:  ZOFRAN  Inject 2 mLs (4 mg total) into the vein every 6 (six) hours as needed for nausea or vomiting.     pantoprazole sodium 40 mg/20 mL Pack  Commonly known as:  PROTONIX  Place 20 mLs (40 mg total) into feeding tube daily.     piperacillin-tazobactam 3.375 GM/50ML IVPB  Commonly known as:  ZOSYN  Inject 50 mLs (3.375 g total) into the vein every 8 (eight) hours.     potassium chloride 20 MEQ/15ML (10%) Soln  Place 22.5 mLs (30 mEq total) into feeding tube every 4 (four) hours.     propofol 1000 MG/100ML Emul injection  Commonly known as:  DIPRIVAN  Inject 0-828 mcg/min into the vein continuous.     risperiDONE 2 MG tablet  Commonly known as:  RISPERDAL  Take 2 mg by mouth at bedtime.     sertraline 100 MG tablet  Commonly known as:  ZOLOFT  Take 100 mg by mouth at bedtime.     traZODone 50 MG tablet  Commonly known as:  DESYREL  Take 50 mg by mouth at bedtime as needed for sleep.          Disposition:  [facility] under the care of Cardiology, Dr. Jamse Arn.   Discharged Condition: LINSI HUMANN has met maximum benefit of inpatient care and is medically stable and cleared for discharge.  Patient is pending follow up as above.      Time spent on disposition:  Greater than 35 minutes.   Signed: Noe Gens, NP-C [facility] Pgr: [phone] Office: [phone]

## 2015-11-15 NOTE — Progress Notes (Signed)
[facility] FOR AM LAB REPLACEMENT ONLY  The patient does apply for the [facility] Electrolyte Replacment Protocol based on the criteria listed below:   1. Is GFR >/= 40 ml/min? Yes.    Patient's GFR today is >60 2. Is urine output >/= 0.5 ml/kg/hr for the last 6 hours? Yes.   Patient's UOP is 4.69 ml/kg/hr 3. Is BUN < 60 mg/dL? Yes.    Patient's BUN today is 12 4. Abnormal electrolyte(s): Potassium 3.2 5. Ordered repletion with: Potassium per protocol 6. If a panic level lab has been reported, has the CCM MD in charge been notified? No..   Physician:    Thomasenia BottomsLANTZY, Caytlyn Evers P 11/15/2015 6:39 AM

## 2015-11-16 LAB — CULTURE, BLOOD (ROUTINE X 2)
Culture: NO GROWTH
Culture: NO GROWTH

## 2015-11-17 LAB — CULTURE, RESPIRATORY: SPECIAL REQUESTS: NORMAL

## 2015-11-19 LAB — CULTURE, BLOOD (ROUTINE X 2)
CULTURE: NO GROWTH
Culture: NO GROWTH

## 2015-11-28 ENCOUNTER — Ambulatory Visit: Payer: Medicare Other | Admitting: Nurse Practitioner

## 2015-12-17 DEATH — deceased

## 2016-08-30 IMAGING — DX DG CHEST 1V PORT
1 series · 1 of 1 positions shown · non-contrast
Comparison: 11/10/2015.  11/09/2015.

CLINICAL DATA: Hypoxia.

EXAM:
PORTABLE CHEST 1 VIEW

[chest ap]
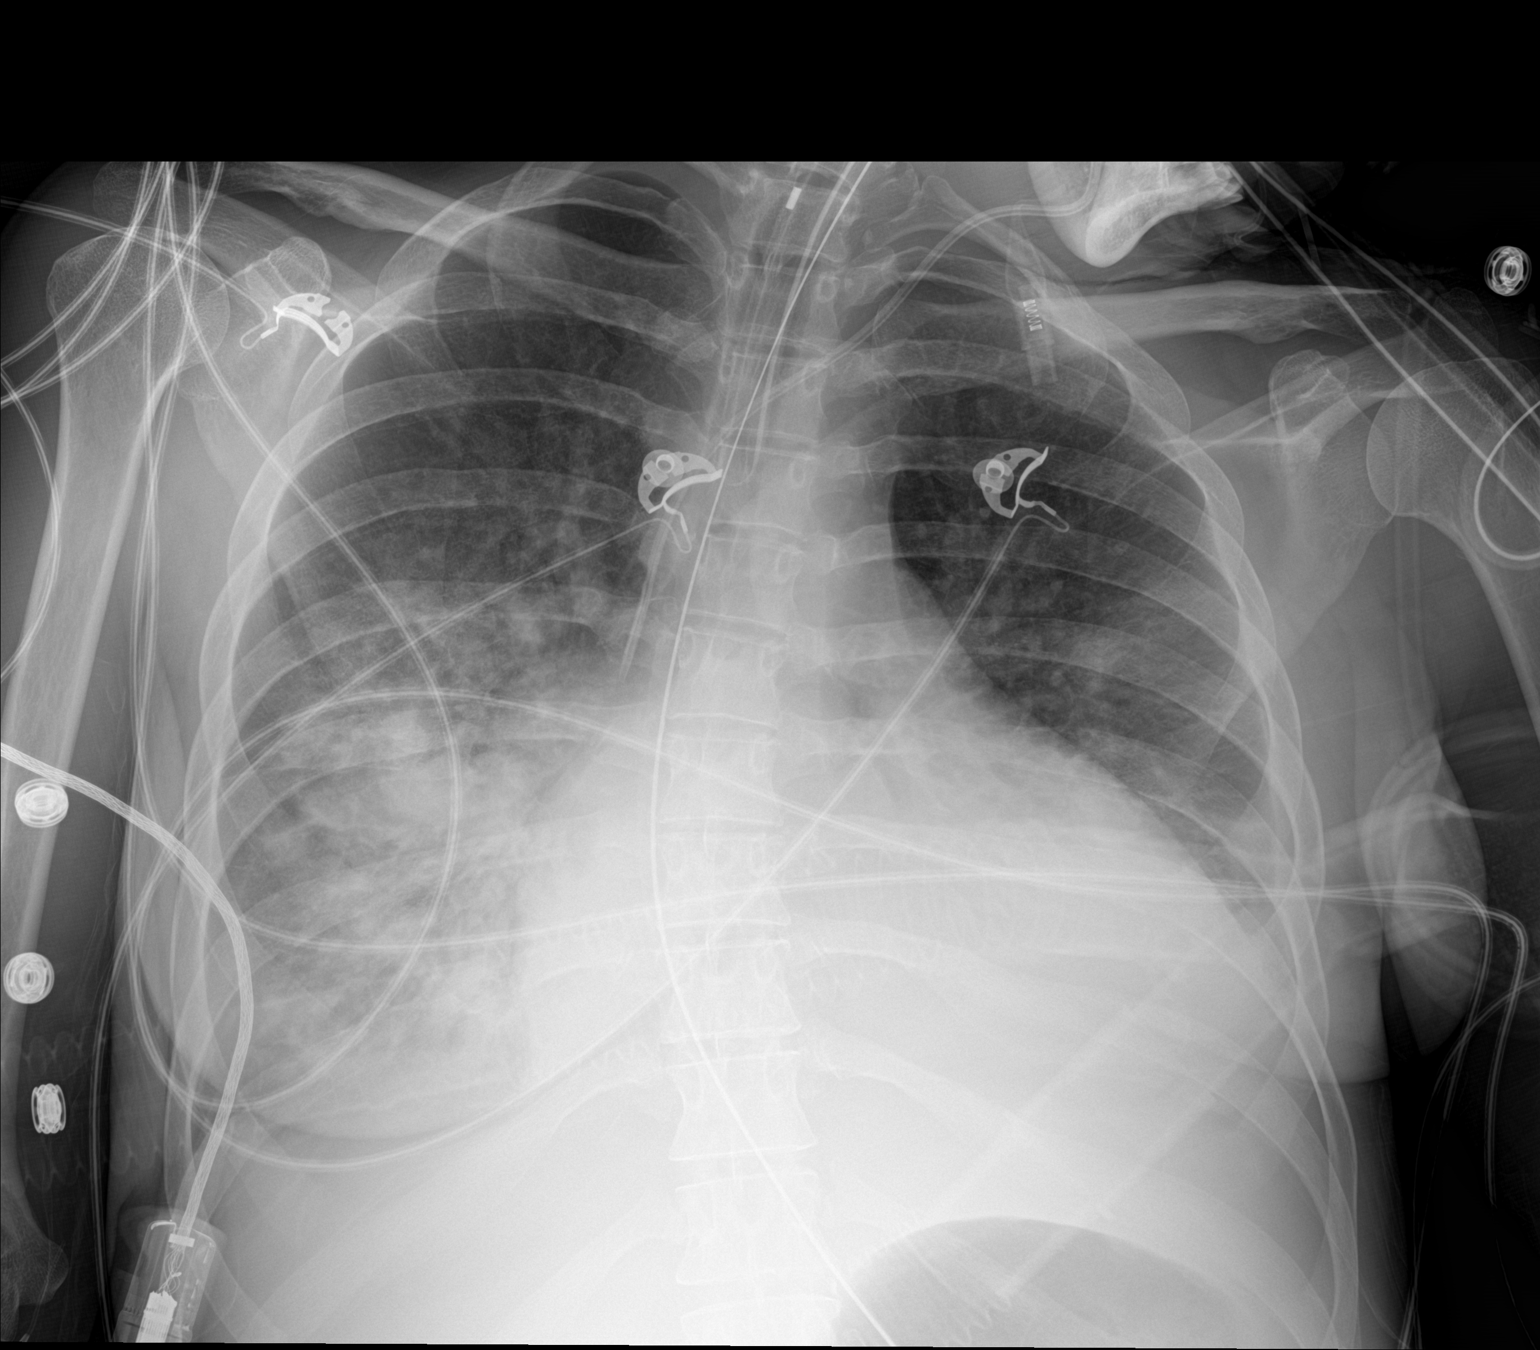

[1 of 1 positions shown; findings below may reference images not displayed]

FINDINGS: Endotracheal tube 3.6 cm above the carina. Left IJ line and NG tube
in stable position. Cardiomegaly with persistent bilateral pulmonary
infiltrates. Bilateral small pleural effusions. No pneumothorax.
IMPRESSION: 1.  Lines and tubes in stable position.

2. Cardiomegaly with persistent bilateral pulmonary
infiltrates/pulmonary edema and bilateral small pleural effusions .

## 2016-09-02 IMAGING — DX DG CHEST 1V PORT
1 series · 1 of 1 positions shown · non-contrast
Comparison: 11/13/2015

CLINICAL DATA: Respiratory failure

EXAM:
PORTABLE CHEST 1 VIEW

[chest ap]
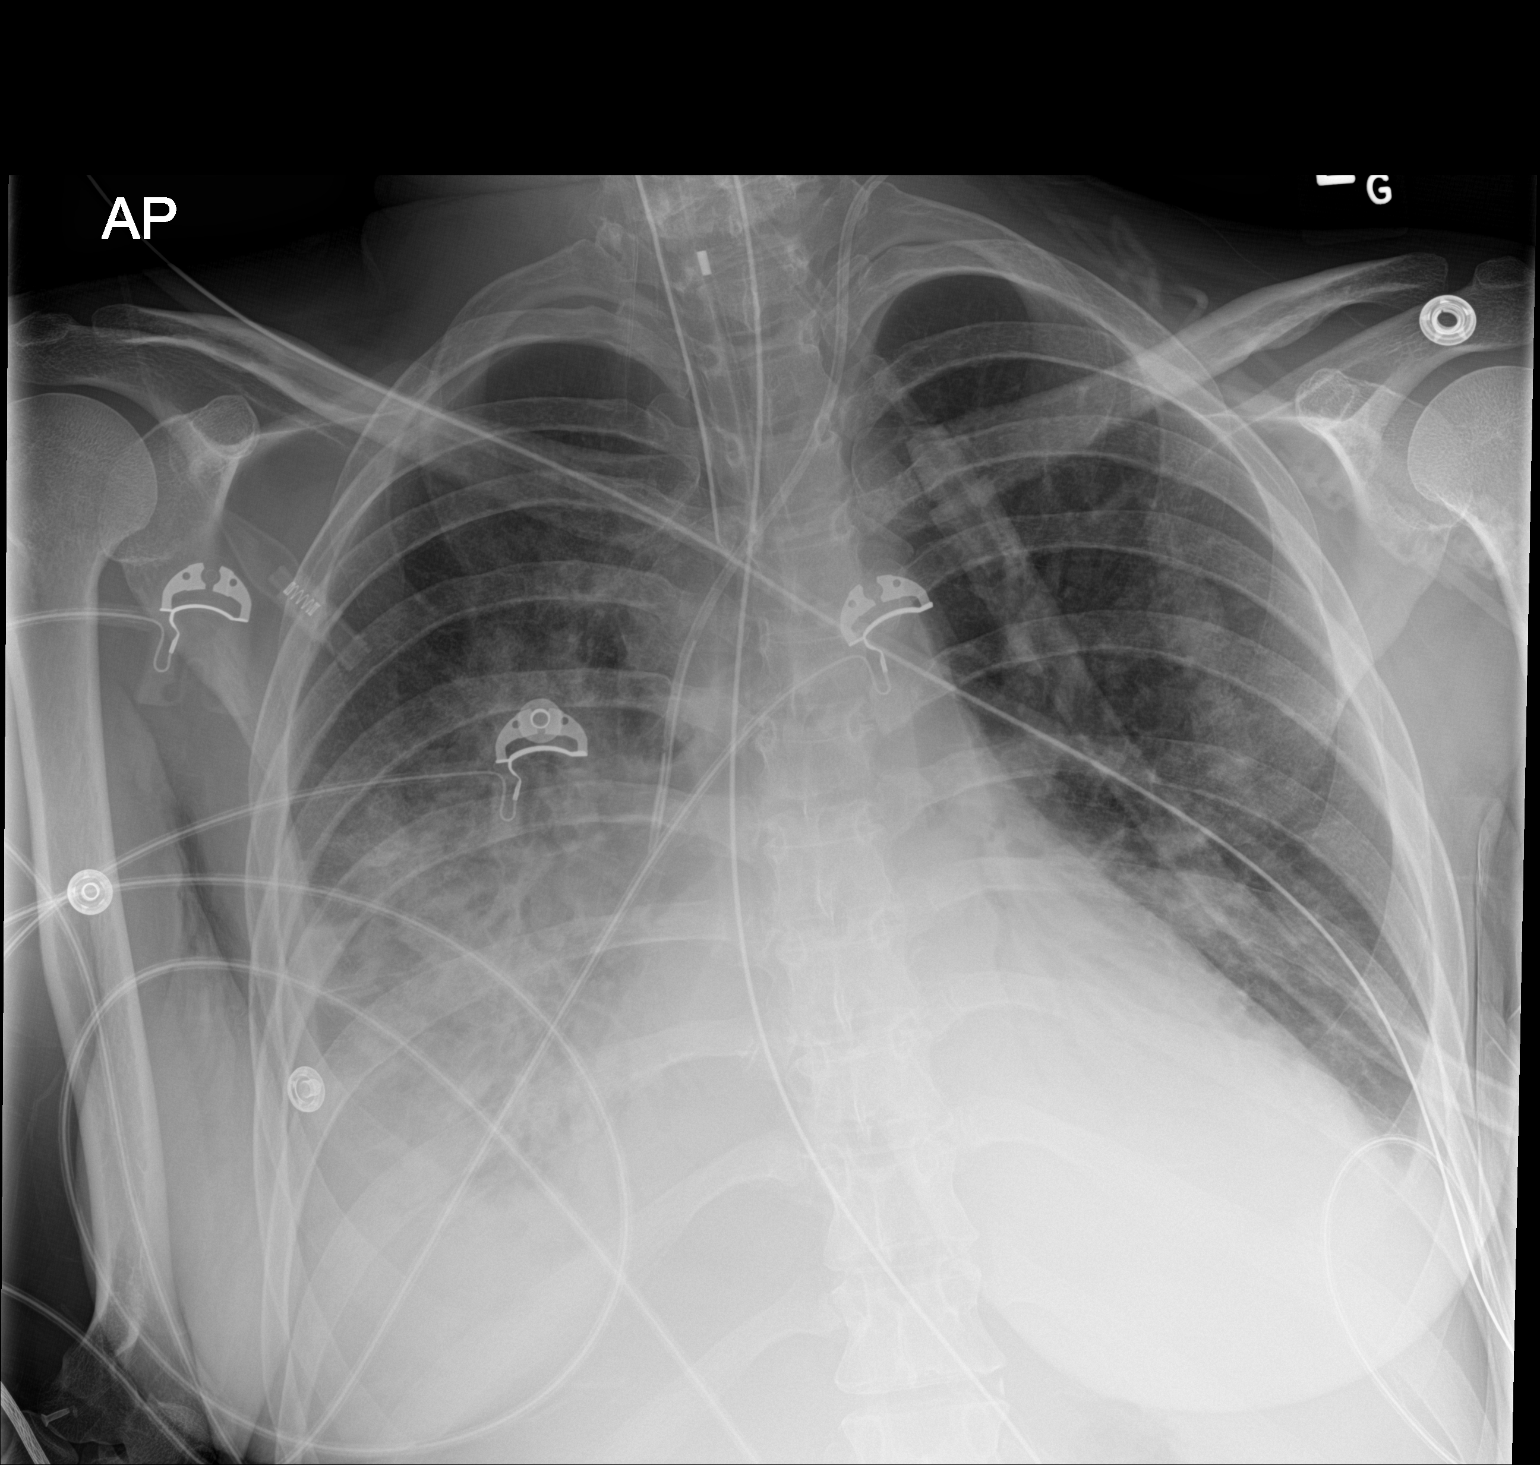

[1 of 1 positions shown; findings below may reference images not displayed]

FINDINGS: Endotracheal tube remains in good position. Central venous catheter
tip in the SVC is unchanged. NG tube enters the stomach.

Improvement in diffuse bilateral airspace disease predominantly in
the bases. There remains significant bilateral airspace disease as
well as bilateral atelectasis. Small bilateral effusions. No
pneumothorax.
IMPRESSION: Support lines remain in good position

Improvement in diffuse bilateral airspace disease most likely due to
clearing edema. Significant bibasilar atelectasis remains. Small
pleural effusions.
